# Patient Record
Sex: Female | Born: 1975
Health system: Southern US, Community
[De-identification: ages and names within clinical notes are randomized; demographics above are authoritative.]

## PROBLEM LIST (undated history)

## (undated) DIAGNOSIS — B009 Herpesviral infection, unspecified: Secondary | ICD-10-CM

## (undated) HISTORY — DX: Herpesviral infection, unspecified: B00.9

---

## 2001-07-31 HISTORY — PX: TUBAL LIGATION: SHX77

## 2005-07-31 HISTORY — PX: BREAST CYST ASPIRATION: SHX578

## 2006-05-25 ENCOUNTER — Ambulatory Visit: Payer: Self-pay

## 2006-11-08 ENCOUNTER — Ambulatory Visit: Payer: Self-pay | Admitting: Obstetrics and Gynecology

## 2006-11-12 ENCOUNTER — Ambulatory Visit: Payer: Self-pay | Admitting: Obstetrics and Gynecology

## 2012-10-29 LAB — HM PAP SMEAR

## 2015-01-07 ENCOUNTER — Encounter: Payer: Self-pay | Admitting: Obstetrics and Gynecology

## 2015-03-01 ENCOUNTER — Encounter: Payer: Self-pay | Admitting: *Deleted

## 2015-03-03 ENCOUNTER — Encounter: Payer: Self-pay | Admitting: Obstetrics and Gynecology

## 2015-12-30 ENCOUNTER — Other Ambulatory Visit: Payer: Self-pay | Admitting: Obstetrics and Gynecology

## 2015-12-30 ENCOUNTER — Encounter: Payer: Self-pay | Admitting: Obstetrics and Gynecology

## 2015-12-30 ENCOUNTER — Ambulatory Visit (INDEPENDENT_AMBULATORY_CARE_PROVIDER_SITE_OTHER): Payer: 59 | Admitting: Obstetrics and Gynecology

## 2015-12-30 VITALS — BP 117/71 | HR 77 | Ht 68.0 in | Wt 145.7 lb

## 2015-12-30 DIAGNOSIS — Z30431 Encounter for routine checking of intrauterine contraceptive device: Secondary | ICD-10-CM

## 2015-12-30 DIAGNOSIS — Z113 Encounter for screening for infections with a predominantly sexual mode of transmission: Secondary | ICD-10-CM

## 2015-12-30 DIAGNOSIS — Z01419 Encounter for gynecological examination (general) (routine) without abnormal findings: Secondary | ICD-10-CM | POA: Diagnosis not present

## 2015-12-30 MED ORDER — VALACYCLOVIR HCL 500 MG PO TABS: 500.0000 mg | ORAL_TABLET | Freq: Two times a day (BID) | ORAL | Status: DC

## 2015-12-30 NOTE — Progress Notes (Signed)
Subjective:   Marissa Harrison is a 40 y.o. No obstetric history on file. Caucasian female here for a routine well-woman exam.  No LMP recorded. Patient is not currently having periods (Reason: IUD).    Current complaints: desires STD screening as caught husband in affair last month PCP: me       does desire labs  Social History: Sexual: heterosexual Marital Status: married Living situation: with spouse Occupation: self-employed Tobacco/alcohol: no alcohol use Illicit drugs: no history of illicit drug use  The following portions of the patient's history were reviewed and updated as appropriate: allergies, current medications, past family history, past medical history, past social history, past surgical history and problem list.  Past Medical History Past Medical History  Diagnosis Date  . HSV infection     Past Surgical History Past Surgical History  Procedure Laterality Date  . Cesarean section  1992  . Tubal ligation  2003    Gynecologic History No obstetric history on file.  No LMP recorded. Patient is not currently having periods (Reason: IUD). Contraception: IUD Last Pap: 2014. Results were: normal Last mammogram: 2015. Results were: normal   Obstetric History OB History  No data available    Current Medications Current Outpatient Prescriptions on File Prior to Visit  Medication Sig Dispense Refill  . levonorgestrel (MIRENA) 20 MCG/24HR IUD 1 each by Intrauterine route once.    . valACYclovir (VALTREX) 500 MG tablet Take 500 mg by mouth 2 (two) times daily.     No current facility-administered medications on file prior to visit.    Review of Systems Patient denies any headaches, blurred vision, shortness of breath, chest pain, abdominal pain, problems with bowel movements, urination, or intercourse.  Objective:  BP 117/71 mmHg  Pulse 77  Ht 5\' 8"  (1.727 m)  Wt 145 lb 11.2 oz (66.089 kg)  BMI 22.16 kg/m2 Physical Exam  General:  Well developed, well  nourished, no acute distress. She is alert and oriented x3. Skin:  Warm and dry Neck:  Midline trachea, no thyromegaly or nodules Cardiovascular: Regular rate and rhythm, no murmur heard Lungs:  Effort normal, all lung fields clear to auscultation bilaterally Breasts:  No dominant palpable mass, retraction, or nipple discharge Abdomen:  Soft, non tender, no hepatosplenomegaly or masses Pelvic:  External genitalia is normal in appearance.  The vagina is normal in appearance. The cervix is bulbous, no CMT. IUD string noted  Thin prep pap is done with HR HPV cotesting. Uterus is felt to be normal size, shape, and contour.  No adnexal masses or tenderness noted.  Extremities:  No swelling or varicosities noted Psych:  She has a normal mood and affect  Assessment:   Healthy well-woman exam IUD check STD screen  Plan:  IUD check Labs obtained F/U 1 year for AE, or sooner if needed Mammogram scheduled  Tedford Berg Suzan NailerN Anmarie Fukushima, CNM

## 2015-12-30 NOTE — Patient Instructions (Signed)
  Place annual gynecologic exam patient instructions here.  Thank you for enrolling in MyChart. Please follow the instructions below to securely access your online medical record. MyChart allows you to send messages to your doctor, view your test results, manage appointments, and more.   How Do I Sign Up? 1. In your Internet browser, go to [employer] and enter [email or web address]. 2. Click on the Sign Up Now link in the Sign In box. You will see the New Member Sign Up page. 3. Enter your MyChart Access Code exactly as it appears below. You will not need to use this code after you've completed the sign-up process. If you do not sign up before the expiration date, you must request a new code.  MyChart Access Code: [email or web address] Expires: 12/31/2015 11:38 AM  4. Enter your Social Security Number ([id number]) and Date of Birth (mm/dd/yyyy) as indicated and click Submit. You will be taken to the next sign-up page. 5. Create a MyChart ID. This will be your MyChart login ID and cannot be changed, so think of one that is secure and easy to remember. 6. Create a MyChart password. You can change your password at any time. 7. Enter your Password Reset Question and Answer. This can be used at a later time if you forget your password.  8. Enter your e-mail address. You will receive e-mail notification when new information is available in MyChart. 9. Click Sign Up. You can now view your medical record.   Additional Information Remember, MyChart is NOT to be used for urgent needs. For medical emergencies, dial 911.

## 2016-01-01 LAB — RPR: RPR Ser Ql: NONREACTIVE

## 2016-01-01 LAB — LIPID PANEL
CHOL/HDL RATIO: 2 ratio (ref 0.0–4.4)
Cholesterol, Total: 203 mg/dL — ABNORMAL HIGH (ref 100–199)
HDL: 100 mg/dL (ref >39)
LDL Calculated: 88 mg/dL (ref 0–99)
TRIGLYCERIDES: 76 mg/dL (ref 0–149)
VLDL Cholesterol Cal: 15 mg/dL (ref 5–40)

## 2016-01-01 LAB — HEPATITIS PANEL, ACUTE
Hep A IgM: NEGATIVE
Hep B C IgM: NEGATIVE
Hep C Virus Ab: <0.1 s/co ratio (ref 0.0–0.9)
Hepatitis B Surface Ag: NEGATIVE

## 2016-01-01 LAB — COMPREHENSIVE METABOLIC PANEL
ALT: 17 IU/L (ref 0–32)
AST: 16 IU/L (ref 0–40)
Albumin/Globulin Ratio: 2.8 — ABNORMAL HIGH (ref 1.2–2.2)
Albumin: 5.1 g/dL (ref 3.5–5.5)
Alkaline Phosphatase: 57 IU/L (ref 39–117)
BILIRUBIN TOTAL: 0.3 mg/dL (ref 0.0–1.2)
BUN/Creatinine Ratio: 20 (ref 9–23)
BUN: 14 mg/dL (ref 6–24)
CALCIUM: 9.7 mg/dL (ref 8.7–10.2)
CO2: 24 mmol/L (ref 18–29)
Chloride: 101 mmol/L (ref 96–106)
Creatinine, Ser: 0.71 mg/dL (ref 0.57–1.00)
GFR calc non Af Amer: 105 mL/min/{1.73_m2} (ref >59)
GFR, EST AFRICAN AMERICAN: 121 mL/min/{1.73_m2} (ref >59)
GLOBULIN, TOTAL: 1.8 g/dL (ref 1.5–4.5)
Glucose: 84 mg/dL (ref 65–99)
POTASSIUM: 4.6 mmol/L (ref 3.5–5.2)
Sodium: 143 mmol/L (ref 134–144)
TOTAL PROTEIN: 6.9 g/dL (ref 6.0–8.5)

## 2016-01-01 LAB — HIV ANTIBODY (ROUTINE TESTING W REFLEX): HIV Screen 4th Generation wRfx: NONREACTIVE

## 2016-01-01 LAB — VITAMIN D 25 HYDROXY (VIT D DEFICIENCY, FRACTURES): Vit D, 25-Hydroxy: 47.1 ng/mL (ref 30.0–100.0)

## 2016-01-03 LAB — CYTOLOGY - PAP

## 2017-01-02 ENCOUNTER — Encounter: Payer: 59 | Admitting: Obstetrics and Gynecology

## 2017-01-09 ENCOUNTER — Encounter: Payer: 59 | Admitting: Obstetrics and Gynecology

## 2017-02-08 ENCOUNTER — Encounter: Payer: Self-pay | Admitting: Obstetrics and Gynecology

## 2017-02-08 ENCOUNTER — Ambulatory Visit (INDEPENDENT_AMBULATORY_CARE_PROVIDER_SITE_OTHER): Payer: 59 | Admitting: Obstetrics and Gynecology

## 2017-02-08 VITALS — BP 120/75 | HR 80 | Ht 67.5 in | Wt 147.2 lb

## 2017-02-08 DIAGNOSIS — Z30431 Encounter for routine checking of intrauterine contraceptive device: Secondary | ICD-10-CM

## 2017-02-08 DIAGNOSIS — Z01419 Encounter for gynecological examination (general) (routine) without abnormal findings: Secondary | ICD-10-CM

## 2017-02-08 DIAGNOSIS — F17200 Nicotine dependence, unspecified, uncomplicated: Secondary | ICD-10-CM

## 2017-02-08 MED ORDER — VALACYCLOVIR HCL 500 MG PO TABS: 500.0000 mg | ORAL_TABLET | Freq: Two times a day (BID) | ORAL | 4 refills | Status: DC

## 2017-02-08 NOTE — Patient Instructions (Signed)
Preventive Care 18-39 Years, Female Preventive care refers to lifestyle choices and visits with your health care provider that can promote health and wellness. What does preventive care include?  A yearly physical exam. This is also called an annual well check.  Dental exams once or twice a year.  Routine eye exams. Ask your health care provider how often you should have your eyes checked.  Personal lifestyle choices, including: ? Daily care of your teeth and gums. ? Regular physical activity. ? Eating a healthy diet. ? Avoiding tobacco and drug use. ? Limiting alcohol use. ? Practicing safe sex. ? Taking vitamin and mineral supplements as recommended by your health care provider. What happens during an annual well check? The services and screenings done by your health care provider during your annual well check will depend on your age, overall health, lifestyle risk factors, and family history of disease. Counseling Your health care provider may ask you questions about your:  Alcohol use.  Tobacco use.  Drug use.  Emotional well-being.  Home and relationship well-being.  Sexual activity.  Eating habits.  Work and work Statistician.  Method of birth control.  Menstrual cycle.  Pregnancy history.  Screening You may have the following tests or measurements:  Height, weight, and BMI.  Diabetes screening. This is done by checking your blood sugar (glucose) after you have not eaten for a while (fasting).  Blood pressure.  Lipid and cholesterol levels. These may be checked every 5 years starting at age 38.  Skin check.  Hepatitis C blood test.  Hepatitis B blood test.  Sexually transmitted disease (STD) testing.  BRCA-related cancer screening. This may be done if you have a family history of breast, ovarian, tubal, or peritoneal cancers.  Pelvic exam and Pap test. This may be done every 3 years starting at age 38. Starting at age 30, this may be done  every 5 years if you have a Pap test in combination with an HPV test.  Discuss your test results, treatment options, and if necessary, the need for more tests with your health care provider. Vaccines Your health care provider may recommend certain vaccines, such as:  Influenza vaccine. This is recommended every year.  Tetanus, diphtheria, and acellular pertussis (Tdap, Td) vaccine. You may need a Td booster every 10 years.  Varicella vaccine. You may need this if you have not been vaccinated.  HPV vaccine. If you are 39 or younger, you may need three doses over 6 months.  Measles, mumps, and rubella (MMR) vaccine. You may need at least one dose of MMR. You may also need a second dose.  Pneumococcal 13-valent conjugate (PCV13) vaccine. You may need this if you have certain conditions and were not previously vaccinated.  Pneumococcal polysaccharide (PPSV23) vaccine. You may need one or two doses if you smoke cigarettes or if you have certain conditions.  Meningococcal vaccine. One dose is recommended if you are age 68-21 years and a first-year college student living in a residence hall, or if you have one of several medical conditions. You may also need additional booster doses.  Hepatitis A vaccine. You may need this if you have certain conditions or if you travel or work in places where you may be exposed to hepatitis A.  Hepatitis B vaccine. You may need this if you have certain conditions or if you travel or work in places where you may be exposed to hepatitis B.  Haemophilus influenzae type b (Hib) vaccine. You may need this  if you have certain risk factors.  Talk to your health care provider about which screenings and vaccines you need and how often you need them. This information is not intended to replace advice given to you by your health care provider. Make sure you discuss any questions you have with your health care provider. Document Released: 09/12/2001 Document Revised:  04/05/2016 Document Reviewed: 05/18/2015 Elsevier Interactive Patient Education  2017 Elsevier Inc.  

## 2017-02-08 NOTE — Progress Notes (Signed)
Subjective:   Marissa Harrison is a [age] y.o. [phone] Caucasian female here for a routine well-woman exam.  No LMP recorded. Patient is not currently having periods (Reason: IUD).    Current complaints: none PCP: me       doesn't desire labs  Social History: Sexual: heterosexual Marital Status: married Living situation: with family Occupation: self-employed Tobacco/alcohol: SMOKER Illicit drugs: no history of illicit drug use  The following portions of the patient's history were reviewed and updated as appropriate: allergies, current medications, past family history, past medical history, past social history, past surgical history and problem list.  Past Medical History Past Medical History:  Diagnosis Date  . HSV infection     Past Surgical History Past Surgical History:  Procedure Laterality Date  . CESAREAN SECTION  1992  . TUBAL LIGATION  2003    Gynecologic History [id number]  No LMP recorded. Patient is not currently having periods (Reason: IUD). Contraception: IUD Last Pap: 2017. Results were: normal Last mammogram: 2014. Results were: normal  Obstetric History OB History  Gravida Para Term Preterm AB Living  4 2     2 2   SAB TAB Ectopic Multiple Live Births  1       2    # Outcome Date GA Lbr Len/2nd Weight Sex Delivery Anes PTL Lv  4 AB           3 Para     M CS-LTranv   LIV  2 Para     F Vag-Spont   LIV  1 SAB               Current Medications Current Outpatient Prescriptions on File Prior to Visit  Medication Sig Dispense Refill  . levonorgestrel (MIRENA) 20 MCG/24HR IUD 1 each by Intrauterine route once.    . valACYclovir (VALTREX) 500 MG tablet Take 1 tablet (500 mg total) by mouth 2 (two) times daily. 60 tablet 4   No current facility-administered medications on file prior to visit.     Review of Systems Patient denies any headaches, blurred vision, shortness of breath, chest pain, abdominal pain, problems with bowel movements, urination, or  intercourse.  Objective:  BP 120/75 (BP Location: Left Arm, Patient Position: Sitting, Cuff Size: Normal)   Pulse 80   Ht 5' 7.5" (1.715 m)   Wt 147 lb 3.2 oz (66.8 kg)   BMI 22.71 kg/m  Physical Exam  General:  Well developed, well nourished, no acute distress. She is alert and oriented x3. Skin:  Warm and dry Neck:  Midline trachea, no thyromegaly or nodules Cardiovascular: Regular rate and rhythm, no murmur heard Lungs:  Effort normal, all lung fields clear to auscultation bilaterally Breasts:  No dominant palpable mass, retraction, or nipple discharge Abdomen:  Soft, non tender, no hepatosplenomegaly or masses Pelvic:  External genitalia is normal in appearance.  The vagina is normal in appearance. The cervix is bulbous, no CMT.  Thin prep pap is not DONE. Uterus is felt to be normal size, shape, and contour.  No adnexal masses or tenderness noted. iud STRING noted. Extremities:  No swelling or varicosities noted Psych:  She has a normal mood and affect  Assessment:   Healthy well-woman exam IUD check noted Smoker-not interested in quiting    Plan:    F/U 1 year for AE, or sooner if needed Mammogram ordered Declined smoking cessation  Melody Suzan NailerN Shambley, CNM

## 2017-02-09 ENCOUNTER — Other Ambulatory Visit: Payer: Self-pay

## 2017-02-09 DIAGNOSIS — N6310 Unspecified lump in the right breast, unspecified quadrant: Secondary | ICD-10-CM

## 2017-03-01 ENCOUNTER — Ambulatory Visit
Admission: RE | Admit: 2017-03-01 | Discharge: 2017-03-01 | Disposition: A | Discharge status: Home / Self Care | Payer: 59 | Source: Ambulatory Visit | Attending: Obstetrics and Gynecology | Admitting: Obstetrics and Gynecology

## 2017-03-01 ENCOUNTER — Other Ambulatory Visit: Payer: Self-pay | Admitting: Obstetrics and Gynecology

## 2017-03-01 DIAGNOSIS — N6310 Unspecified lump in the right breast, unspecified quadrant: Secondary | ICD-10-CM

## 2017-03-01 DIAGNOSIS — N6311 Unspecified lump in the right breast, upper outer quadrant: Secondary | ICD-10-CM | POA: Insufficient documentation

## 2017-03-01 DIAGNOSIS — R928 Other abnormal and inconclusive findings on diagnostic imaging of breast: Secondary | ICD-10-CM

## 2017-03-22 ENCOUNTER — Other Ambulatory Visit: Payer: 59

## 2017-04-13 ENCOUNTER — Ambulatory Visit
Admission: RE | Admit: 2017-04-13 | Discharge: 2017-04-13 | Disposition: A | Discharge status: Home / Self Care | Payer: 59 | Source: Ambulatory Visit | Attending: Obstetrics and Gynecology | Admitting: Obstetrics and Gynecology

## 2017-04-13 DIAGNOSIS — N6310 Unspecified lump in the right breast, unspecified quadrant: Secondary | ICD-10-CM | POA: Diagnosis not present

## 2017-04-13 DIAGNOSIS — R928 Other abnormal and inconclusive findings on diagnostic imaging of breast: Secondary | ICD-10-CM | POA: Diagnosis present

## 2017-04-13 HISTORY — PX: BREAST BIOPSY: SHX20

## 2017-04-17 LAB — SURGICAL PATHOLOGY

## 2017-09-25 ENCOUNTER — Other Ambulatory Visit: Payer: Self-pay | Admitting: *Deleted

## 2017-09-25 ENCOUNTER — Telehealth: Payer: Self-pay | Admitting: Obstetrics and Gynecology

## 2017-09-25 DIAGNOSIS — R928 Other abnormal and inconclusive findings on diagnostic imaging of breast: Secondary | ICD-10-CM

## 2017-09-25 NOTE — Telephone Encounter (Signed)
The patient called and stated that she [facility] Mel[facility], No other information was disclosed. Please advise.

## 2017-09-25 NOTE — Telephone Encounter (Signed)
Put orders in for 6 month follow up

## 2017-09-28 ENCOUNTER — Other Ambulatory Visit: Payer: Self-pay | Admitting: *Deleted

## 2017-09-28 DIAGNOSIS — R928 Other abnormal and inconclusive findings on diagnostic imaging of breast: Secondary | ICD-10-CM

## 2017-10-29 ENCOUNTER — Ambulatory Visit
Admission: RE | Admit: 2017-10-29 | Discharge: 2017-10-29 | Disposition: A | Discharge status: Home / Self Care | Payer: 59 | Source: Ambulatory Visit | Attending: Certified Nurse Midwife | Admitting: Certified Nurse Midwife

## 2017-10-29 DIAGNOSIS — R928 Other abnormal and inconclusive findings on diagnostic imaging of breast: Secondary | ICD-10-CM

## 2018-02-14 ENCOUNTER — Encounter: Payer: Self-pay | Admitting: Obstetrics and Gynecology

## 2018-02-14 ENCOUNTER — Ambulatory Visit (INDEPENDENT_AMBULATORY_CARE_PROVIDER_SITE_OTHER): Payer: 59 | Admitting: Obstetrics and Gynecology

## 2018-02-14 VITALS — BP 110/67 | HR 79 | Ht 68.0 in | Wt 146.3 lb

## 2018-02-14 DIAGNOSIS — Z01419 Encounter for gynecological examination (general) (routine) without abnormal findings: Secondary | ICD-10-CM | POA: Diagnosis not present

## 2018-02-14 MED ORDER — VALACYCLOVIR HCL 500 MG PO TABS
500.0000 mg | ORAL_TABLET | Freq: Two times a day (BID) | ORAL | 4 refills | Status: DC
Start: 2018-02-14 — End: 2020-06-29

## 2018-02-14 NOTE — Patient Instructions (Signed)
Preventive Care 18-39 Years, Female Preventive care refers to lifestyle choices and visits with your health care provider that can promote health and wellness. What does preventive care include?  A yearly physical exam. This is also called an annual well check.  Dental exams once or twice a year.  Routine eye exams. Ask your health care provider how often you should have your eyes checked.  Personal lifestyle choices, including: ? Daily care of your teeth and gums. ? Regular physical activity. ? Eating a healthy diet. ? Avoiding tobacco and drug use. ? Limiting alcohol use. ? Practicing safe sex. ? Taking vitamin and mineral supplements as recommended by your health care provider. What happens during an annual well check? The services and screenings done by your health care provider during your annual well check will depend on your age, overall health, lifestyle risk factors, and family history of disease. Counseling Your health care provider may ask you questions about your:  Alcohol use.  Tobacco use.  Drug use.  Emotional well-being.  Home and relationship well-being.  Sexual activity.  Eating habits.  Work and work Statistician.  Method of birth control.  Menstrual cycle.  Pregnancy history.  Screening You may have the following tests or measurements:  Height, weight, and BMI.  Diabetes screening. This is done by checking your blood sugar (glucose) after you have not eaten for a while (fasting).  Blood pressure.  Lipid and cholesterol levels. These may be checked every 5 years starting at age 38.  Skin check.  Hepatitis C blood test.  Hepatitis B blood test.  Sexually transmitted disease (STD) testing.  BRCA-related cancer screening. This may be done if you have a family history of breast, ovarian, tubal, or peritoneal cancers.  Pelvic exam and Pap test. This may be done every 3 years starting at age 38. Starting at age 30, this may be done  every 5 years if you have a Pap test in combination with an HPV test.  Discuss your test results, treatment options, and if necessary, the need for more tests with your health care provider. Vaccines Your health care provider may recommend certain vaccines, such as:  Influenza vaccine. This is recommended every year.  Tetanus, diphtheria, and acellular pertussis (Tdap, Td) vaccine. You may need a Td booster every 10 years.  Varicella vaccine. You may need this if you have not been vaccinated.  HPV vaccine. If you are 39 or younger, you may need three doses over 6 months.  Measles, mumps, and rubella (MMR) vaccine. You may need at least one dose of MMR. You may also need a second dose.  Pneumococcal 13-valent conjugate (PCV13) vaccine. You may need this if you have certain conditions and were not previously vaccinated.  Pneumococcal polysaccharide (PPSV23) vaccine. You may need one or two doses if you smoke cigarettes or if you have certain conditions.  Meningococcal vaccine. One dose is recommended if you are age 68-21 years and a first-year college student living in a residence hall, or if you have one of several medical conditions. You may also need additional booster doses.  Hepatitis A vaccine. You may need this if you have certain conditions or if you travel or work in places where you may be exposed to hepatitis A.  Hepatitis B vaccine. You may need this if you have certain conditions or if you travel or work in places where you may be exposed to hepatitis B.  Haemophilus influenzae type b (Hib) vaccine. You may need this  if you have certain risk factors.  Talk to your health care provider about which screenings and vaccines you need and how often you need them. This information is not intended to replace advice given to you by your health care provider. Make sure you discuss any questions you have with your health care provider. Document Released: 09/12/2001 Document Revised:  04/05/2016 Document Reviewed: 05/18/2015 Elsevier Interactive Patient Education  2018 Elsevier Inc.  

## 2018-02-14 NOTE — Progress Notes (Signed)
Subjective:   Marissa Harrison is a 42 y.o. [phone] Caucasian female here for a routine well-woman exam.  No LMP recorded. (Menstrual status: IUD).    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  Past Medical History Past Medical History:  Diagnosis Date  . HSV infection     Past Surgical History Past Surgical History:  Procedure Laterality Date  . BREAST BIOPSY Right 04/13/2017   BENIGN NODULAR ADENOSIS, wing shaped marker  . BREAST CYST ASPIRATION Right 2007   neg  . CESAREAN SECTION  1992  . TUBAL LIGATION  2003    Gynecologic History [id number]  No LMP recorded. (Menstrual status: IUD). Contraception: IUD Last Pap: 2017. Results were: normal Last mammogram: 02/2018. Results were: abnormal on rt with negative biopsy  Obstetric History OB History  Gravida Para Term Preterm AB Living  4 2     2 2   SAB TAB Ectopic Multiple Live Births  1       2    # Outcome Date GA Lbr Len/2nd Weight Sex Delivery Anes PTL Lv  4 AB           3 Para     M CS-LTranv   LIV  2 Para     F Vag-Spont   LIV  1 SAB             Current Medications Current Outpatient Medications on File Prior to Visit  Medication Sig Dispense Refill  . levonorgestrel (MIRENA) 20 MCG/24HR IUD 1 each by Intrauterine route once.    . valACYclovir (VALTREX) 500 MG tablet Take 1 tablet (500 mg total) by mouth 2 (two) times daily. 60 tablet 4   No current  facility-administered medications on file prior to visit.     Review of Systems Patient denies any headaches, blurred vision, shortness of breath, chest pain, abdominal pain, problems with bowel movements, urination, or intercourse.  Objective:  BP 110/67   Pulse 79   Ht 5\' 8"  (1.727 m)   Wt 146 lb 4.8 oz (66.4 kg)   BMI 22.24 kg/m  Physical Exam  General:  Well developed, well nourished, no acute distress. She is alert and oriented x3. Skin:  Warm and dry Neck:  Midline trachea, no thyromegaly or nodules Cardiovascular: Regular rate and rhythm, no murmur heard Lungs:  Effort normal, all lung fields clear to auscultation bilaterally Breasts:  No dominant palpable mass, retraction, or nipple discharge Abdomen:  Soft, non tender, no hepatosplenomegaly or masses Pelvic:  External genitalia is normal in appearance.  The vagina is normal in appearance. The cervix is bulbous, no CMT. IUD string visualized. Thin prep pap is not done .IUD string. Uterus is felt to be normal size, shape, and contour.  No adnexal masses or tenderness noted. Extremities:  No swelling or varicosities noted Psych:  She has a normal mood and affect  Assessment:   Healthy well-woman exam IUD check Smoker- no interest in quiting   Plan:  Labs to be obtained today  with follow up as necessary F/U 1 year for AE, or sooner if needed Mammogram ordered  Alayasia Breeding Suzan Nailer, CNM

## 2018-02-15 LAB — COMPREHENSIVE METABOLIC PANEL
ALBUMIN: 4.5 g/dL (ref 3.5–5.5)
ALK PHOS: 72 IU/L (ref 39–117)
ALT: 18 IU/L (ref 0–32)
AST: 19 IU/L (ref 0–40)
Albumin/Globulin Ratio: 2 (ref 1.2–2.2)
BUN / CREAT RATIO: 27 — AB (ref 9–23)
BUN: 19 mg/dL (ref 6–24)
Bilirubin Total: 0.3 mg/dL (ref 0.0–1.2)
CHLORIDE: 101 mmol/L (ref 96–106)
CO2: 25 mmol/L (ref 20–29)
CREATININE: 0.7 mg/dL (ref 0.57–1.00)
Calcium: 9.5 mg/dL (ref 8.7–10.2)
GFR calc Af Amer: 122 mL/min/{1.73_m2} (ref >59)
GFR calc non Af Amer: 106 mL/min/{1.73_m2} (ref >59)
GLOBULIN, TOTAL: 2.2 g/dL (ref 1.5–4.5)
Glucose: 83 mg/dL (ref 65–99)
Potassium: 4.3 mmol/L (ref 3.5–5.2)
SODIUM: 141 mmol/L (ref 134–144)
Total Protein: 6.7 g/dL (ref 6.0–8.5)

## 2018-02-15 LAB — LIPID PANEL
Chol/HDL Ratio: 1.9 ratio (ref 0.0–4.4)
Cholesterol, Total: 184 mg/dL (ref 100–199)
HDL: 96 mg/dL (ref >39)
LDL Calculated: 76 mg/dL (ref 0–99)
Triglycerides: 58 mg/dL (ref 0–149)
VLDL Cholesterol Cal: 12 mg/dL (ref 5–40)

## 2018-02-18 ENCOUNTER — Telehealth: Payer: Self-pay | Admitting: Obstetrics and Gynecology

## 2018-02-18 NOTE — Telephone Encounter (Signed)
I called [facility] to schedule the patients mammogram and the representative I spoke with stated that she needs to have the following orders put in [id number], [id number], and [id number] (Diagnostic bilateral mammogram for left &right breast/ w/ u/s). The patient Was advised to follow up in 6 months from her prior appointment, When the orders are in I will call and schedule and contact the patient. Please advise.

## 2018-02-25 ENCOUNTER — Other Ambulatory Visit: Payer: Self-pay | Admitting: *Deleted

## 2018-02-25 DIAGNOSIS — N6310 Unspecified lump in the right breast, unspecified quadrant: Secondary | ICD-10-CM

## 2018-02-25 NOTE — Telephone Encounter (Signed)
Put in new orders

## 2018-05-08 ENCOUNTER — Ambulatory Visit
Admission: RE | Admit: 2018-05-08 | Discharge: 2018-05-08 | Disposition: A | Discharge status: Home / Self Care | Payer: 59 | Source: Ambulatory Visit | Attending: Obstetrics and Gynecology | Admitting: Obstetrics and Gynecology

## 2018-05-08 DIAGNOSIS — N6310 Unspecified lump in the right breast, unspecified quadrant: Secondary | ICD-10-CM

## 2018-09-02 DIAGNOSIS — J1189 Influenza due to unidentified influenza virus with other manifestations: Secondary | ICD-10-CM | POA: Diagnosis not present

## 2018-09-11 DIAGNOSIS — L309 Dermatitis, unspecified: Secondary | ICD-10-CM | POA: Diagnosis not present

## 2018-09-11 DIAGNOSIS — R208 Other disturbances of skin sensation: Secondary | ICD-10-CM | POA: Diagnosis not present

## 2018-09-11 DIAGNOSIS — D225 Melanocytic nevi of trunk: Secondary | ICD-10-CM | POA: Diagnosis not present

## 2018-09-11 DIAGNOSIS — D2262 Melanocytic nevi of left upper limb, including shoulder: Secondary | ICD-10-CM | POA: Diagnosis not present

## 2018-09-11 DIAGNOSIS — L538 Other specified erythematous conditions: Secondary | ICD-10-CM | POA: Diagnosis not present

## 2018-09-11 DIAGNOSIS — B078 Other viral warts: Secondary | ICD-10-CM | POA: Diagnosis not present

## 2018-09-11 DIAGNOSIS — D2261 Melanocytic nevi of right upper limb, including shoulder: Secondary | ICD-10-CM | POA: Diagnosis not present

## 2019-02-20 ENCOUNTER — Encounter: Payer: 59 | Admitting: Obstetrics and Gynecology

## 2019-02-28 ENCOUNTER — Encounter: Payer: Self-pay | Admitting: Obstetrics and Gynecology

## 2019-02-28 ENCOUNTER — Other Ambulatory Visit (HOSPITAL_COMMUNITY)
Admission: RE | Admit: 2019-02-28 | Discharge: 2019-02-28 | Disposition: A | Discharge status: Home / Self Care | Payer: BC Managed Care – PPO | Source: Ambulatory Visit | Attending: Obstetrics and Gynecology | Admitting: Obstetrics and Gynecology

## 2019-02-28 ENCOUNTER — Encounter: Payer: 59 | Admitting: Obstetrics and Gynecology

## 2019-02-28 ENCOUNTER — Other Ambulatory Visit: Payer: Self-pay

## 2019-02-28 ENCOUNTER — Ambulatory Visit (INDEPENDENT_AMBULATORY_CARE_PROVIDER_SITE_OTHER): Payer: BC Managed Care – PPO | Admitting: Obstetrics and Gynecology

## 2019-02-28 VITALS — BP 127/71 | HR 72 | Ht 68.0 in | Wt 149.1 lb

## 2019-02-28 DIAGNOSIS — Z01419 Encounter for gynecological examination (general) (routine) without abnormal findings: Secondary | ICD-10-CM

## 2019-02-28 DIAGNOSIS — Z30431 Encounter for routine checking of intrauterine contraceptive device: Secondary | ICD-10-CM

## 2019-02-28 MED ORDER — NAFTIFINE HCL 1 % EX GEL: 1.0000 "application " | CUTANEOUS | 2 refills | Status: DC | PRN

## 2019-02-28 NOTE — Progress Notes (Signed)
Subjective:   Marissa Harrison is a 43 y.o. [phone] Caucasian female here for a routine well-woman exam.  No LMP recorded. (Menstrual status: IUD).   IUD due to be changed next year. Current complaints: none PCP: me       doesn't desire labs  Social History: Sexual: heterosexual Marital Status: married Living situation: with family Occupation: self-employed Tobacco/alcohol: smokes 1ppd, occasional alcohol use Illicit drugs: no history of illicit drug use  The following portions of the patient's history were reviewed and updated as appropriate: allergies, current medications, past family history, past medical history, past social history, past surgical history and problem list.  Past Medical History Past Medical History:  Diagnosis Date  . HSV infection     Past Surgical History Past Surgical History:  Procedure Laterality Date  . BREAST BIOPSY Right 04/13/2017   BENIGN NODULAR ADENOSIS, wing shaped marker  . BREAST CYST ASPIRATION Right 2007   neg  . CESAREAN SECTION  1992  . TUBAL LIGATION  2003    Gynecologic History [id number]  No LMP recorded. (Menstrual status: IUD). Contraception: IUD and tubal ligation Last Pap: 2017. Results were: normal Last mammogram: 04/2018. Results were: normal   Obstetric History OB History  Gravida Para Term Preterm AB Living  4 2     2 2   SAB TAB Ectopic Multiple Live Births  1       2    # Outcome Date GA Lbr Len/2nd Weight Sex Delivery Anes PTL Lv  4 AB           3 Para     M CS-LTranv   LIV  2 Para     F Vag-Spont   LIV  1 SAB             Current Medications Current Outpatient Medications on File Prior to Visit  Medication Sig Dispense Refill  . levonorgestrel (MIRENA) 20 MCG/24HR IUD 1 each by Intrauterine route once.    . valACYclovir (VALTREX) 500 MG tablet Take 1 tablet (500 mg total) by mouth 2 (two) times daily. 60 tablet 4   No current facility-administered medications on file prior to visit.     Review of  Systems Patient denies any headaches, blurred vision, shortness of breath, chest pain, abdominal pain, problems with bowel movements, urination, or intercourse.  Objective:  BP 127/71   Pulse 72   Ht 5\' 8"  (1.727 m)   Wt 149 lb 1.6 oz (67.6 kg)   BMI 22.67 kg/m  Physical Exam  General:  Well developed, well nourished, no acute distress. She is alert and oriented x3. Skin:  Warm and dry Neck:  Midline trachea, no thyromegaly or nodules Cardiovascular: Regular rate and rhythm, no murmur heard Lungs:  Effort normal, all lung fields clear to auscultation bilaterally Breasts:  No dominant palpable mass, retraction, or nipple discharge Abdomen:  Soft, non tender, no hepatosplenomegaly or masses Pelvic:  External genitalia is normal in appearance.  The vagina is normal in appearance. The cervix is bulbous, no CMT.  Thin prep pap is done with HR HPV cotesting. Uterus is felt to be normal size, shape, and contour.  IUD string noted. No adnexal masses or tenderness noted. Extremities:  No swelling or varicosities noted Psych:  She has a normal mood and affect  Assessment:   Healthy well-woman exam IUD check Plan:  Will wait on labs until next year F/U 1 year for AE, or sooner if needed Mammogram ordered  Marissa Harrison, CNM

## 2019-02-28 NOTE — Patient Instructions (Signed)
 Preventive Care 21-45 Years Old, Female Preventive care refers to visits with your health care provider and lifestyle choices that can promote health and wellness. This includes:  A yearly physical exam. This may also be called an annual well check.  Regular dental visits and eye exams.  Immunizations.  Screening for certain conditions.  Healthy lifestyle choices, such as eating a healthy diet, getting regular exercise, not using drugs or products that contain nicotine and tobacco, and limiting alcohol use. What can I expect for my preventive care visit? Physical exam Your health care provider will check your:  Height and weight. This may be used to calculate body mass index (BMI), which tells if you are at a healthy weight.  Heart rate and blood pressure.  Skin for abnormal spots. Counseling Your health care provider may ask you questions about your:  Alcohol, tobacco, and drug use.  Emotional well-being.  Home and relationship well-being.  Sexual activity.  Eating habits.  Work and work environment.  Method of birth control.  Menstrual cycle.  Pregnancy history. What immunizations do I need?  Influenza (flu) vaccine  This is recommended every year. Tetanus, diphtheria, and pertussis (Tdap) vaccine  You may need a Td booster every 10 years. Varicella (chickenpox) vaccine  You may need this if you have not been vaccinated. Human papillomavirus (HPV) vaccine  If recommended by your health care provider, you may need three doses over 6 months. Measles, mumps, and rubella (MMR) vaccine  You may need at least one dose of MMR. You may also need a second dose. Meningococcal conjugate (MenACWY) vaccine  One dose is recommended if you are age 19-21 years and a first-year college student living in a residence hall, or if you have one of several medical conditions. You may also need additional booster doses. Pneumococcal conjugate (PCV13) vaccine  You may need  this if you have certain conditions and were not previously vaccinated. Pneumococcal polysaccharide (PPSV23) vaccine  You may need one or two doses if you smoke cigarettes or if you have certain conditions. Hepatitis A vaccine  You may need this if you have certain conditions or if you travel or work in places where you may be exposed to hepatitis A. Hepatitis B vaccine  You may need this if you have certain conditions or if you travel or work in places where you may be exposed to hepatitis B. Haemophilus influenzae type b (Hib) vaccine  You may need this if you have certain conditions. You may receive vaccines as individual doses or as more than one vaccine together in one shot (combination vaccines). Talk with your health care provider about the risks and benefits of combination vaccines. What tests do I need?  Blood tests  Lipid and cholesterol levels. These may be checked every 5 years starting at age 20.  Hepatitis C test.  Hepatitis B test. Screening  Diabetes screening. This is done by checking your blood sugar (glucose) after you have not eaten for a while (fasting).  Sexually transmitted disease (STD) testing.  BRCA-related cancer screening. This may be done if you have a family history of breast, ovarian, tubal, or peritoneal cancers.  Pelvic exam and Pap test. This may be done every 3 years starting at age 21. Starting at age 30, this may be done every 5 years if you have a Pap test in combination with an HPV test. Talk with your health care provider about your test results, treatment options, and if necessary, the need for more   tests. Follow these instructions at home: Eating and drinking   Eat a diet that includes fresh fruits and vegetables, whole grains, lean protein, and low-fat dairy.  Take vitamin and mineral supplements as recommended by your health care provider.  Do not drink alcohol if: ? Your health care provider tells you not to drink. ? You are  pregnant, may be pregnant, or are planning to become pregnant.  If you drink alcohol: ? Limit how much you have to 0-1 drink a day. ? Be aware of how much alcohol is in your drink. In the [place] one drink equals one 12 oz bottle of beer (355 mL), one 5 oz glass of wine (148 mL), or one 1 oz glass of hard liquor (44 mL). Lifestyle  Take daily care of your teeth and gums.  Stay active. Exercise for at least 30 minutes on 5 or more days each week.  Do not use any products that contain nicotine or tobacco, such as cigarettes, e-cigarettes, and chewing tobacco. If you need help quitting, ask your health care provider.  If you are sexually active, practice safe sex. Use a condom or other form of birth control (contraception) in order to prevent pregnancy and STIs (sexually transmitted infections). If you plan to become pregnant, see your health care provider for a preconception visit. What's next?  Visit your health care provider once a year for a well check visit.  Ask your health care provider how often you should have your eyes and teeth checked.  Stay up to date on all vaccines. This information is not intended to replace advice given to you by your health care provider. Make sure you discuss any questions you have with your health care provider. Document Released: 09/12/2001 Document Revised: 03/28/2018 Document Reviewed: 03/28/2018 Elsevier Patient Education  2020 [employer].

## 2019-03-07 LAB — CYTOLOGY - PAP
Chlamydia: NEGATIVE
Diagnosis: NEGATIVE
HPV: NOT DETECTED
Neisseria Gonorrhea: NEGATIVE

## 2019-08-05 ENCOUNTER — Ambulatory Visit
Admission: RE | Admit: 2019-08-05 | Discharge: 2019-08-05 | Disposition: A | Discharge status: Home / Self Care | Payer: BC Managed Care – PPO | Source: Ambulatory Visit | Attending: Obstetrics and Gynecology | Admitting: Obstetrics and Gynecology

## 2019-08-05 DIAGNOSIS — Z1231 Encounter for screening mammogram for malignant neoplasm of breast: Secondary | ICD-10-CM | POA: Insufficient documentation

## 2019-08-05 DIAGNOSIS — Z01419 Encounter for gynecological examination (general) (routine) without abnormal findings: Secondary | ICD-10-CM

## 2019-08-27 ENCOUNTER — Ambulatory Visit (INDEPENDENT_AMBULATORY_CARE_PROVIDER_SITE_OTHER): Payer: BC Managed Care – PPO | Admitting: Obstetrics and Gynecology

## 2019-08-27 ENCOUNTER — Other Ambulatory Visit: Payer: Self-pay

## 2019-08-27 ENCOUNTER — Encounter: Payer: Self-pay | Admitting: Obstetrics and Gynecology

## 2019-08-27 VITALS — BP 156/61 | HR 94 | Ht 68.0 in | Wt 142.0 lb

## 2019-08-27 DIAGNOSIS — Z30432 Encounter for removal of intrauterine contraceptive device: Secondary | ICD-10-CM | POA: Diagnosis not present

## 2019-08-27 DIAGNOSIS — Z30430 Encounter for insertion of intrauterine contraceptive device: Secondary | ICD-10-CM | POA: Diagnosis not present

## 2019-08-27 NOTE — Progress Notes (Signed)
HPI:      [name] is a 44 y.o. [id number] who LMP was No LMP recorded. (Menstrual status: IUD).  Subjective:   She presents today because her IUD has expired and she would like a new one placed.  She has had 3 IUDs and has had good experiences with all of them.  She typically does not have menses with them.    Hx: The following portions of the patient's history were reviewed and updated as appropriate:             She  has a past medical history of HSV infection. She does not have a problem list on file. She  has a past surgical history that includes Cesarean section (1992); Tubal ligation (2003); Breast cyst aspiration (Right, 2007); and Breast biopsy (Right, 04/13/2017). Her family history includes Breast cancer in her paternal aunt; Breast cancer (age of onset: 35) in her maternal grandmother; Cancer in her maternal grandfather; Cirrhosis in her father; Hypertension in her father. She  reports that she has been smoking cigarettes. She has a 10.00 pack-year smoking history. She has never used smokeless tobacco. She reports current alcohol use. She reports that she does not use drugs. She has a current medication list which includes the following prescription(s): levonorgestrel, valacyclovir, and naftifine hcl. She has No Known Allergies.       Review of Systems:  Review of Systems  Constitutional: Denied constitutional symptoms, night sweats, recent illness, fatigue, fever, insomnia and weight loss.  Eyes: Denied eye symptoms, eye pain, photophobia, vision change and visual disturbance.  Ears/Nose/Throat/Neck: Denied ear, nose, throat or neck symptoms, hearing loss, nasal discharge, sinus congestion and sore throat.  Cardiovascular: Denied cardiovascular symptoms, arrhythmia, chest pain/pressure, edema, exercise intolerance, orthopnea and palpitations.  Respiratory: Denied pulmonary symptoms, asthma, pleuritic pain, productive sputum, cough, dyspnea and wheezing.  Gastrointestinal:  Denied, gastro-esophageal reflux, melena, nausea and vomiting.  Genitourinary: Denied genitourinary symptoms including symptomatic vaginal discharge, pelvic relaxation issues, and urinary complaints.  Musculoskeletal: Denied musculoskeletal symptoms, stiffness, swelling, muscle weakness and myalgia.  Dermatologic: Denied dermatology symptoms, rash and scar.  Neurologic: Denied neurology symptoms, dizziness, headache, neck pain and syncope.  Psychiatric: Denied psychiatric symptoms, anxiety and depression.  Endocrine: Denied endocrine symptoms including hot flashes and night sweats.   Meds:   Current Outpatient Medications on File Prior to Visit  Medication Sig Dispense Refill  . levonorgestrel (MIRENA) 20 MCG/24HR IUD 1 each by Intrauterine route once.    . valACYclovir (VALTREX) 500 MG tablet Take 1 tablet (500 mg total) by mouth 2 (two) times daily. 60 tablet 4  . Naftifine HCl 1 % GEL Apply 1 application topically as needed. (Patient not taking: Reported on 08/27/2019) 90 g 2   No current facility-administered medications on file prior to visit.    Objective:     Vitals:   08/27/19 1509  BP: (!) 156/61  Pulse: 94              Physical examination   Pelvic:   Vulva: Normal appearance.  No lesions.  Vagina: No lesions or abnormalities noted.  Support: Normal pelvic support.  Urethra No masses tenderness or scarring.  Meatus Normal size without lesions or prolapse.  Cervix: Normal appearance.  No lesions.  Anus: Normal exam.  No lesions.  Perineum: Normal exam.  No lesions.        Bimanual   Uterus: Normal size.  Non-tender.  Mobile.  Mid  Adnexae: No masses.  Non-tender to palpation.  Cul-de-sac: Negative for abnormality.   IUD Removal Strings of IUD identified and grasped.  IUD removed without problem.  Pt tolerated this well.  IUD noted to be intact.  IUD Procedure Pt has read the booklet and signed the appropriate forms regarding the Mirena IUD.  All of her questions  have been answered.   The cervix was cleansed with betadine solution.  After sounding the uterus and noting the position, the IUD was placed in the usual manner without problem.  The string was cut to the appropriate length.  The patient tolerated the procedure well.    Assessment:    [id number] There are no problems to display for this patient.    1. Encounter for insertion of mirena IUD   2. Encounter for IUD removal        Plan:            1.  IUD placed without problem-expectant management Orders No orders of the defined types were placed in this encounter.   No orders of the defined types were placed in this encounter.     F/U  Return in about 4 weeks (around 09/24/2019) for For IUD f/u.  Finis Bud, M.D. 08/27/2019 3:51 PM

## 2019-09-24 ENCOUNTER — Encounter: Payer: Self-pay | Admitting: Obstetrics and Gynecology

## 2019-09-24 ENCOUNTER — Ambulatory Visit (INDEPENDENT_AMBULATORY_CARE_PROVIDER_SITE_OTHER): Payer: BC Managed Care – PPO | Admitting: Obstetrics and Gynecology

## 2019-09-24 ENCOUNTER — Other Ambulatory Visit: Payer: Self-pay

## 2019-09-24 VITALS — BP 128/75 | HR 89 | Ht 68.0 in | Wt 143.8 lb

## 2019-09-24 DIAGNOSIS — Z30431 Encounter for routine checking of intrauterine contraceptive device: Secondary | ICD-10-CM

## 2019-09-24 NOTE — Progress Notes (Signed)
HPI:      [name] is a 44 y.o. [id number] who LMP was No LMP recorded. (Menstrual status: IUD).  Subjective:   She presents today for follow-up of her IUD.  She reports that she had a few days of spotting after insertion but has had no bleeding since.  She has had intercourse without problem.    Hx: The following portions of the patient's history were reviewed and updated as appropriate:             She  has a past medical history of HSV infection. She does not have a problem list on file. She  has a past surgical history that includes Cesarean section (1992); Tubal ligation (2003); Breast cyst aspiration (Right, 2007); and Breast biopsy (Right, 04/13/2017). Her family history includes Breast cancer in her paternal aunt; Breast cancer (age of onset: 84) in her maternal grandmother; Cancer in her maternal grandfather; Cirrhosis in her father; Hypertension in her father. She  reports that she has been smoking cigarettes. She has a 10.00 pack-year smoking history. She has never used smokeless tobacco. She reports current alcohol use. She reports that she does not use drugs. She has a current medication list which includes the following prescription(s): levonorgestrel, naftifine hcl, and valacyclovir. She has No Known Allergies.       Review of Systems:  Review of Systems  Constitutional: Denied constitutional symptoms, night sweats, recent illness, fatigue, fever, insomnia and weight loss.  Eyes: Denied eye symptoms, eye pain, photophobia, vision change and visual disturbance.  Ears/Nose/Throat/Neck: Denied ear, nose, throat or neck symptoms, hearing loss, nasal discharge, sinus congestion and sore throat.  Cardiovascular: Denied cardiovascular symptoms, arrhythmia, chest pain/pressure, edema, exercise intolerance, orthopnea and palpitations.  Respiratory: Denied pulmonary symptoms, asthma, pleuritic pain, productive sputum, cough, dyspnea and wheezing.  Gastrointestinal: Denied,  gastro-esophageal reflux, melena, nausea and vomiting.  Genitourinary: Denied genitourinary symptoms including symptomatic vaginal discharge, pelvic relaxation issues, and urinary complaints.  Musculoskeletal: Denied musculoskeletal symptoms, stiffness, swelling, muscle weakness and myalgia.  Dermatologic: Denied dermatology symptoms, rash and scar.  Neurologic: Denied neurology symptoms, dizziness, headache, neck pain and syncope.  Psychiatric: Denied psychiatric symptoms, anxiety and depression.  Endocrine: Denied endocrine symptoms including hot flashes and night sweats.   Meds:   Current Outpatient Medications on File Prior to Visit  Medication Sig Dispense Refill  . levonorgestrel (MIRENA) 20 MCG/24HR IUD 1 each by Intrauterine route once.    . Naftifine HCl 1 % GEL Apply 1 application topically as needed. (Patient not taking: Reported on 08/27/2019) 90 g 2  . valACYclovir (VALTREX) 500 MG tablet Take 1 tablet (500 mg total) by mouth 2 (two) times daily. 60 tablet 4   No current facility-administered medications on file prior to visit.    Objective:     Vitals:   09/24/19 1419  BP: 128/75  Pulse: 89              Physical examination   Pelvic:   Vulva: Normal appearance.  No lesions.  Vagina: No lesions or abnormalities noted.  Support: Normal pelvic support.  Urethra No masses tenderness or scarring.  Meatus Normal size without lesions or prolapse.  Cervix: Normal appearance.  No lesions. IUD strings noted at cervical os.  Anus: Normal exam.  No lesions.  Perineum: Normal exam.  No lesions.        Bimanual   Uterus: Normal size.  Non-tender.  Mobile.  AV.  Adnexae: No masses.  Non-tender to palpation.  Cul-de-sac: Negative for abnormality.     Assessment:    [id number] There are no problems to display for this patient.    1. Surveillance of previously prescribed intrauterine contraceptive device     No problems.   Plan:            1.  Patient to follow-up  for routine care and annual examination. Orders No orders of the defined types were placed in this encounter.   No orders of the defined types were placed in this encounter.     F/U  Return for Annual Physical.  Finis Bud, M.D. 09/24/2019 2:37 PM

## 2020-03-03 ENCOUNTER — Encounter: Payer: BC Managed Care – PPO | Admitting: Obstetrics and Gynecology

## 2020-03-10 ENCOUNTER — Encounter: Payer: BC Managed Care – PPO | Admitting: Obstetrics and Gynecology

## 2020-04-06 ENCOUNTER — Other Ambulatory Visit: Payer: Self-pay

## 2020-04-06 ENCOUNTER — Ambulatory Visit (INDEPENDENT_AMBULATORY_CARE_PROVIDER_SITE_OTHER): Payer: BC Managed Care – PPO | Admitting: Obstetrics and Gynecology

## 2020-04-06 ENCOUNTER — Encounter: Payer: Self-pay | Admitting: Obstetrics and Gynecology

## 2020-04-06 VITALS — BP 114/69 | HR 84 | Ht 68.0 in | Wt 143.2 lb

## 2020-04-06 DIAGNOSIS — Z01419 Encounter for gynecological examination (general) (routine) without abnormal findings: Secondary | ICD-10-CM

## 2020-04-06 NOTE — Progress Notes (Signed)
HPI:      [name] is a 44 y.o. [id number] who LMP was No LMP recorded. (Menstrual status: IUD).  Subjective:   She presents today for her annual examination.  She has no complaints.  She is happy with her IUD.  She is not having menses. She is not fasting at this time and will return for blood work in the near future.    Hx: The following portions of the patient's history were reviewed and updated as appropriate:             She  has a past medical history of HSV infection. She does not have a problem list on file. She  has a past surgical history that includes Cesarean section (1992); Tubal ligation (2003); Breast cyst aspiration (Right, 2007); and Breast biopsy (Right, 04/13/2017). Her family history includes Breast cancer in her paternal aunt; Breast cancer (age of onset: 46) in her maternal grandmother; Cancer in her maternal grandfather; Cirrhosis in her father; Hypertension in her father. She  reports that she has been smoking cigarettes. She has a 10.00 pack-year smoking history. She has never used smokeless tobacco. She reports current alcohol use. She reports that she does not use drugs. She has a current medication list which includes the following prescription(s): levonorgestrel and valacyclovir. She has No Known Allergies.       Review of Systems:  Review of Systems  Constitutional: Denied constitutional symptoms, night sweats, recent illness, fatigue, fever, insomnia and weight loss.  Eyes: Denied eye symptoms, eye pain, photophobia, vision change and visual disturbance.  Ears/Nose/Throat/Neck: Denied ear, nose, throat or neck symptoms, hearing loss, nasal discharge, sinus congestion and sore throat.  Cardiovascular: Denied cardiovascular symptoms, arrhythmia, chest pain/pressure, edema, exercise intolerance, orthopnea and palpitations.  Respiratory: Denied pulmonary symptoms, asthma, pleuritic pain, productive sputum, cough, dyspnea and wheezing.  Gastrointestinal:  Denied, gastro-esophageal reflux, melena, nausea and vomiting.  Genitourinary: Denied genitourinary symptoms including symptomatic vaginal discharge, pelvic relaxation issues, and urinary complaints.  Musculoskeletal: Denied musculoskeletal symptoms, stiffness, swelling, muscle weakness and myalgia.  Dermatologic: Denied dermatology symptoms, rash and scar.  Neurologic: Denied neurology symptoms, dizziness, headache, neck pain and syncope.  Psychiatric: Denied psychiatric symptoms, anxiety and depression.  Endocrine: Denied endocrine symptoms including hot flashes and night sweats.   Meds:   Current Outpatient Medications on File Prior to Visit  Medication Sig Dispense Refill  . levonorgestrel (MIRENA) 20 MCG/24HR IUD 1 each by Intrauterine route once.    . valACYclovir (VALTREX) 500 MG tablet Take 1 tablet (500 mg total) by mouth 2 (two) times daily. 60 tablet 4   No current facility-administered medications on file prior to visit.    Objective:     Vitals:   04/06/20 1427  BP: 114/69  Pulse: 84              Physical examination General NAD, Conversant  HEENT Atraumatic; Op clear with mmm.  Normo-cephalic. Pupils reactive. Anicteric sclerae  Thyroid/Neck Smooth without nodularity or enlargement. Normal ROM.  Neck Supple.  Skin No rashes, lesions or ulceration. Normal palpated skin turgor. No nodularity.  Breasts: No masses or discharge.  Symmetric.  No axillary adenopathy.  Lungs: Clear to auscultation.No rales or wheezes. Normal Respiratory effort, no retractions.  Heart: NSR.  No murmurs or rubs appreciated. No periferal edema  Abdomen: Soft.  Non-tender.  No masses.  No HSM. No hernia  Extremities: Moves all appropriately.  Normal ROM for age. No lymphadenopathy.  Neuro: Oriented to PPT.  Normal mood. Normal affect.     Pelvic:   Vulva: Normal appearance.  No lesions.  Vagina: No lesions or abnormalities noted.  Support: Normal pelvic support.  Urethra No masses  tenderness or scarring.  Meatus Normal size without lesions or prolapse.  Cervix: Normal appearance.  No lesions.  IUD strings noted at cervical os  Anus: Normal exam.  No lesions.  Perineum: Normal exam.  No lesions.        Bimanual   Uterus: Normal size.  Non-tender.  Mobile.  AV.  Adnexae: No masses.  Non-tender to palpation.  Cul-de-sac: Negative for abnormality.      Assessment:    [id number] There are no problems to display for this patient.    1. Well woman exam with routine gynecological exam     Normal exam  IUD without problem   Plan:            1.  Basic Screening Recommendations The basic screening recommendations for asymptomatic women were discussed with the patient during her visit.  The age-appropriate recommendations were discussed with her and the rational for the tests reviewed.  When I am informed by the patient that another primary care physician has previously obtained the age-appropriate tests and they are up-to-date, only outstanding tests are ordered and referrals given as necessary.  Abnormal results of tests will be discussed with her when all of her results are completed.  Routine preventative health maintenance measures emphasized: Exercise/Diet/Weight control, Tobacco Warnings, Alcohol/Substance use risks and Stress Management Mammogram ordered for January Patient will return for fasting morning blood work Orders Orders Placed This Encounter  Procedures  . MM DIGITAL SCREENING BILATERAL    No orders of the defined types were placed in this encounter.           F/U  Return in about 1 year (around 04/06/2021) for Annual Physical.  Elonda Husky, M.D. 04/06/2020 3:01 PM

## 2020-04-09 ENCOUNTER — Other Ambulatory Visit: Payer: Self-pay

## 2020-04-09 ENCOUNTER — Other Ambulatory Visit: Payer: BC Managed Care – PPO

## 2020-04-09 DIAGNOSIS — Z01419 Encounter for gynecological examination (general) (routine) without abnormal findings: Secondary | ICD-10-CM | POA: Diagnosis not present

## 2020-04-10 LAB — BASIC METABOLIC PANEL
BUN/Creatinine Ratio: 26 — ABNORMAL HIGH (ref 9–23)
BUN: 18 mg/dL (ref 6–24)
CO2: 26 mmol/L (ref 20–29)
Calcium: 9.5 mg/dL (ref 8.7–10.2)
Chloride: 104 mmol/L (ref 96–106)
Creatinine, Ser: 0.7 mg/dL (ref 0.57–1.00)
GFR calc Af Amer: 120 mL/min/{1.73_m2} (ref >59)
GFR calc non Af Amer: 104 mL/min/{1.73_m2} (ref >59)
Glucose: 75 mg/dL (ref 65–99)
Potassium: 5.5 mmol/L — ABNORMAL HIGH (ref 3.5–5.2)
Sodium: 143 mmol/L (ref 134–144)

## 2020-04-10 LAB — CHOLESTEROL, TOTAL: Cholesterol, Total: 195 mg/dL (ref 100–199)

## 2020-04-10 LAB — TSH: TSH: 0.905 u[IU]/mL (ref 0.450–4.500)

## 2020-04-10 LAB — CBC
Hematocrit: 41 % (ref 34.0–46.6)
Hemoglobin: 13.6 g/dL (ref 11.1–15.9)
MCH: 32.5 pg (ref 26.6–33.0)
MCHC: 33.2 g/dL (ref 31.5–35.7)
MCV: 98 fL — ABNORMAL HIGH (ref 79–97)
Platelets: 268 10*3/uL (ref 150–450)
RBC: 4.18 x10E6/uL (ref 3.77–5.28)
RDW: 12.6 % (ref 11.7–15.4)
WBC: 9.6 10*3/uL (ref 3.4–10.8)

## 2020-06-29 ENCOUNTER — Other Ambulatory Visit: Payer: Self-pay | Admitting: Obstetrics and Gynecology

## 2020-08-18 ENCOUNTER — Other Ambulatory Visit: Payer: Self-pay

## 2020-08-18 ENCOUNTER — Ambulatory Visit
Admission: RE | Admit: 2020-08-18 | Discharge: 2020-08-18 | Disposition: A | Discharge status: Home / Self Care | Payer: BC Managed Care – PPO | Source: Ambulatory Visit | Attending: Obstetrics and Gynecology | Admitting: Obstetrics and Gynecology

## 2020-08-18 DIAGNOSIS — Z1231 Encounter for screening mammogram for malignant neoplasm of breast: Secondary | ICD-10-CM | POA: Insufficient documentation

## 2020-08-18 DIAGNOSIS — Z01419 Encounter for gynecological examination (general) (routine) without abnormal findings: Secondary | ICD-10-CM

## 2020-09-16 DIAGNOSIS — B351 Tinea unguium: Secondary | ICD-10-CM | POA: Diagnosis not present

## 2020-09-16 DIAGNOSIS — B353 Tinea pedis: Secondary | ICD-10-CM | POA: Diagnosis not present

## 2021-02-08 ENCOUNTER — Telehealth: Payer: BC Managed Care – PPO | Admitting: Obstetrics and Gynecology

## 2021-02-08 MED ORDER — VALACYCLOVIR HCL 500 MG PO TABS: 500.0000 mg | ORAL_TABLET | Freq: Two times a day (BID) | ORAL | 1 refills | Status: DC

## 2021-02-08 NOTE — Telephone Encounter (Signed)
Pt is requesting a refill on Valtrex be sent to her pharmacy- confirmed as [facility] in [place]. Please Advise.

## 2021-02-08 NOTE — Telephone Encounter (Signed)
RX sent to the pharmacy and notified patient that it has been sent in.

## 2021-04-11 NOTE — Patient Instructions (Signed)
Breast Self-Awareness Breast self-awareness is knowing how your breasts look and feel. Doing breast self-awareness is important. It allows you to catch a breast problem early while it is still small and can be treated. All women should do breast self-awareness, including women who have had breast implants. Tell your doctor if you notice a change in your breasts. What you need: A mirror. A well-lit room. How to do a breast self-exam A breast self-exam is one way to learn what is normal for your breasts and to check for changes. To do a breast self-exam: Look for changes  Take off all the clothes above your waist. Stand in front of a mirror in a room with good lighting. Put your hands on your hips. Push your hands down. Look at your breasts and nipples in the mirror to see if one breast or nipple looks different from the other. Check to see if: The shape of one breast is different. The size of one breast is different. There are wrinkles, dips, and bumps in one breast and not the other. Look at each breast for changes in the skin, such as: Redness. Scaly areas. Look for changes in your nipples, such as: Liquid around the nipples. Bleeding. Dimpling. Redness. A change in where the nipples are. Feel for changes  Lie on your back on the floor. Feel each breast. To do this, follow these steps: Pick a breast to feel. Put the arm closest to that breast above your head. Use your other arm to feel the nipple area of your breast. Feel the area with the pads of your three middle fingers by making small circles with your fingers. For the first circle, press lightly. For the second circle, press harder. For the third circle, press even harder. Keep making circles with your fingers at the different pressures as you move down your breast. Stop when you feel your ribs. Move your fingers a little toward the center of your body. Start making circles with your fingers again, this time going up until  you reach your collarbone. Keep making up-and-down circles until you reach your armpit. Remember to keep using the three pressures. Feel the other breast in the same way. Sit or stand in the tub or shower. With soapy water on your skin, feel each breast the same way you did in step 2 when you were lying on the floor. Write down what you find Writing down what you find can help you remember what to tell your doctor. Write down: What is normal for each breast. Any changes you find in each breast, including: The kind of changes you find. Whether you have pain. Size and location of any lumps. When you last had your menstrual period. General tips Check your breasts every month. If you are breastfeeding, the best time to check your breasts is after you feed your baby or after you use a breast pump. If you get menstrual periods, the best time to check your breasts is 5-7 days after your menstrual period is over. With time, you will become comfortable with the self-exam, and you will begin to know if there are changes in your breasts. Contact a doctor if you: See a change in the shape or size of your breasts or nipples. See a change in the skin of your breast or nipples, such as red or scaly skin. Have fluid coming from your nipples that is not normal. Find a lump or thick area that was not there before. Have pain in   your breasts. Have any concerns about your breast health. Summary Breast self-awareness includes looking for changes in your breasts, as well as feeling for changes within your breasts. Breast self-awareness should be done in front of a mirror in a well-lit room. You should check your breasts every month. If you get menstrual periods, the best time to check your breasts is 5-7 days after your menstrual period is over. Let your doctor know of any changes you see in your breasts, including changes in size, changes on the skin, pain or tenderness, or fluid from your nipples that is not  normal. This information is not intended to replace advice given to you by your health care provider. Make sure you discuss any questions you have with your health care provider. Document Revised: 03/05/2018 Document Reviewed: 03/05/2018 Elsevier Patient Education  2022 [employer]      Preventive Care 71-3 Years Old, Female Preventive care refers to lifestyle choices and visits with your health care provider that can promote health and wellness. This includes: A yearly physical exam. This is also called an annual wellness visit. Regular dental and eye exams. Immunizations. Screening for certain conditions. Healthy lifestyle choices, such as: Eating a healthy diet. Getting regular exercise. Not using drugs or products that contain nicotine and tobacco. Limiting alcohol use. What can I expect for my preventive care visit? Physical exam Your health care provider will check your: Height and weight. These may be used to calculate your BMI (body mass index). BMI is a measurement that tells if you are at a healthy weight. Heart rate and blood pressure. Body temperature. Skin for abnormal spots. Counseling Your health care provider may ask you questions about your: Past medical problems. Family's medical history. Alcohol, tobacco, and drug use. Emotional well-being. Home life and relationship well-being. Sexual activity. Diet, exercise, and sleep habits. Work and work Statistician. Access to firearms. Method of birth control. Menstrual cycle. Pregnancy history. What immunizations do I need? Vaccines are usually given at various ages, according to a schedule. Your health care provider will recommend vaccines for you based on your age, medical history, and lifestyle or other factors, such as travel or where you work. What tests do I need? Blood tests Lipid and cholesterol levels. These may be checked every 5 years, or more often if you are over 21 years old. Hepatitis C  test. Hepatitis B test. Screening Lung cancer screening. You may have this screening every year starting at age 49 if you have a 30-pack-year history of smoking and currently smoke or have quit within the past 15 years. Colorectal cancer screening. All adults should have this screening starting at age 4 and continuing until age 7. Your health care provider may recommend screening at age 51 if you are at increased risk. You will have tests every 1-10 years, depending on your results and the type of screening test. Diabetes screening. This is done by checking your blood sugar (glucose) after you have not eaten for a while (fasting). You may have this done every 1-3 years. Mammogram. This may be done every 1-2 years. Talk with your health care provider about when you should start having regular mammograms. This may depend on whether you have a family history of breast cancer. BRCA-related cancer screening. This may be done if you have a family history of breast, ovarian, tubal, or peritoneal cancers. Pelvic exam and Pap test. This may be done every 3 years starting at age 37. Starting at age 67, this may be  done every 5 years if you have a Pap test in combination with an HPV test. Other tests STD (sexually transmitted disease) testing, if you are at risk. Bone density scan. This is done to screen for osteoporosis. You may have this scan if you are at high risk for osteoporosis. Talk with your health care provider about your test results, treatment options, and if necessary, the need for more tests. Follow these instructions at home: Eating and drinking  Eat a diet that includes fresh fruits and vegetables, whole grains, lean protein, and low-fat dairy products. Take vitamin and mineral supplements as recommended by your health care provider. Do not drink alcohol if: Your health care provider tells you not to drink. You are pregnant, may be pregnant, or are planning to become pregnant. If  you drink alcohol: Limit how much you have to 0-1 drink a day. Be aware of how much alcohol is in your drink. In the [place] one drink equals one 12 oz bottle of beer (355 mL), one 5 oz glass of wine (148 mL), or one 1 oz glass of hard liquor (44 mL). Lifestyle Take daily care of your teeth and gums. Brush your teeth every morning and night with fluoride toothpaste. Floss one time each day. Stay active. Exercise for at least 30 minutes 5 or more days each week. Do not use any products that contain nicotine or tobacco, such as cigarettes, e-cigarettes, and chewing tobacco. If you need help quitting, ask your health care provider. Do not use drugs. If you are sexually active, practice safe sex. Use a condom or other form of protection to prevent STIs (sexually transmitted infections). If you do not wish to become pregnant, use a form of birth control. If you plan to become pregnant, see your health care provider for a prepregnancy visit. If told by your health care provider, take low-dose aspirin daily starting at age 41. Find healthy ways to cope with stress, such as: Meditation, yoga, or listening to music. Journaling. Talking to a trusted person. Spending time with friends and family. Safety Always wear your seat belt while driving or riding in a vehicle. Do not drive: If you have been drinking alcohol. Do not ride with someone who has been drinking. When you are tired or distracted. While texting. Wear a helmet and other protective equipment during sports activities. If you have firearms in your house, make sure you follow all gun safety procedures. What's next? Visit your health care provider once a year for an annual wellness visit. Ask your health care provider how often you should have your eyes and teeth checked. Stay up to date on all vaccines. This information is not intended to replace advice given to you by your health care provider. Make sure you discuss any questions you have  with your health care provider. Document Revised: 09/24/2020 Document Reviewed: 03/28/2018 Elsevier Patient Education  2022 [employer].

## 2021-04-12 ENCOUNTER — Ambulatory Visit (INDEPENDENT_AMBULATORY_CARE_PROVIDER_SITE_OTHER): Payer: BC Managed Care – PPO | Admitting: Obstetrics and Gynecology

## 2021-04-12 ENCOUNTER — Encounter: Payer: Self-pay | Admitting: Obstetrics and Gynecology

## 2021-04-12 ENCOUNTER — Other Ambulatory Visit: Payer: Self-pay

## 2021-04-12 VITALS — BP 151/79 | HR 94 | Ht 68.0 in | Wt 138.3 lb

## 2021-04-12 DIAGNOSIS — R102 Pelvic and perineal pain unspecified side: Secondary | ICD-10-CM

## 2021-04-12 DIAGNOSIS — Z01419 Encounter for gynecological examination (general) (routine) without abnormal findings: Secondary | ICD-10-CM

## 2021-04-12 MED ORDER — VALACYCLOVIR HCL 500 MG PO TABS: 500.0000 mg | ORAL_TABLET | Freq: Two times a day (BID) | ORAL | 11 refills | Status: DC

## 2021-04-12 NOTE — Progress Notes (Signed)
HPI:      [name] is a 45 y.o. [id number] who LMP was No LMP recorded (lmp unknown). (Menstrual status: IUD).  Subjective:   She presents today for her annual examination.  She generally is doing well but states that 1-2 times per month when she first gets up in the morning she has some lightheadedness that quickly resolves.  Her mother has a history of hyperglycemia with similar symptoms. [place] also occasionally has bilateral right and left lower quadrant pain at random times.  The pain is not disabling. She has an IUD and is not having menstrual periods.    Hx: The following portions of the patient's history were reviewed and updated as appropriate:             She  has a past medical history of HSV infection. She does not have a problem list on file. She  has a past surgical history that includes Cesarean section (1992); Tubal ligation (2003); Breast cyst aspiration (Right, 2007); and Breast biopsy (Right, 04/13/2017). Her family history includes Breast cancer in her paternal aunt; Breast cancer (age of onset: 41) in her maternal grandmother; Cancer in her maternal grandfather; Cirrhosis in her father; Hypertension in her father. She  reports that she has been smoking cigarettes. She has a 10.00 pack-year smoking history. She has never used smokeless tobacco. She reports current alcohol use. She reports that she does not use drugs. She has a current medication list which includes the following prescription(s): levonorgestrel and valacyclovir. She has No Known Allergies.       Review of Systems:  Review of Systems  Constitutional: Denied constitutional symptoms, night sweats, recent illness, fatigue, fever, insomnia and weight loss.  Eyes: Denied eye symptoms, eye pain, photophobia, vision change and visual disturbance.  Ears/Nose/Throat/Neck: Denied ear, nose, throat or neck symptoms, hearing loss, nasal discharge, sinus congestion and sore throat.  Cardiovascular: Denied  cardiovascular symptoms, arrhythmia, chest pain/pressure, edema, exercise intolerance, orthopnea and palpitations.  Respiratory: Denied pulmonary symptoms, asthma, pleuritic pain, productive sputum, cough, dyspnea and wheezing.  Gastrointestinal: Denied, gastro-esophageal reflux, melena, nausea and vomiting.  Genitourinary:\ Denied genitourinary symptoms including symptomatic vaginal discharge, pelvic relaxation issues, and urinary complaints.  Musculoskeletal: Denied musculoskeletal symptoms, stiffness, swelling, muscle weakness and myalgia.  Dermatologic: Denied dermatology symptoms, rash and scar.  Neurologic: Denied neurology symptoms, dizziness, headache, neck pain and syncope.  Psychiatric: Denied psychiatric symptoms, anxiety and depression.  Endocrine: Denied endocrine symptoms including hot flashes and night sweats.   Meds:   Current Outpatient Medications on File Prior to Visit  Medication Sig Dispense Refill   levonorgestrel (MIRENA) 20 MCG/24HR IUD 1 each by Intrauterine route once.     No current facility-administered medications on file prior to visit.     Objective:     Vitals:   04/12/21 1427  BP: (!) 151/79  Pulse: 94    Filed Weights   04/12/21 1427  Weight: 138 lb 4.8 oz (62.7 kg)              Physical examination General NAD, Conversant  HEENT Atraumatic; Op clear with mmm.  Normo-cephalic. Pupils reactive. Anicteric sclerae  Thyroid/Neck Smooth without nodularity or enlargement. Normal ROM.  Neck Supple.  Skin No rashes, lesions or ulceration. Normal palpated skin turgor. No nodularity.  Breasts: No masses or discharge.  Symmetric.  No axillary adenopathy.  Lungs: Clear to auscultation.No rales or wheezes. Normal Respiratory effort, no retractions.  Heart: NSR.  No murmurs or rubs appreciated. No  periferal edema  Abdomen: Soft.  Non-tender.  No masses.  No HSM. No hernia  Extremities: Moves all appropriately.  Normal ROM for age. No lymphadenopathy.   Neuro: Oriented to PPT.  Normal mood. Normal affect.     Pelvic:   Vulva: Normal appearance.  No lesions.  Vagina: No lesions or abnormalities noted.  Support: Normal pelvic support.  Urethra No masses tenderness or scarring.  Meatus Normal size without lesions or prolapse.  Cervix: Normal appearance.  No lesions.  Anus: Normal exam.  No lesions.  Perineum: Normal exam.  No lesions.        Bimanual   Uterus: Normal size.  Non-tender.  Mobile.  AV.  Adnexae: No masses.  Non-tender to palpation.  Cul-de-sac: Negative for abnormality.     Assessment:    [id number] There are no problems to display for this patient.    1. Well woman exam with routine gynecological exam     Possible hypoglycemic symptoms but rare.   Plan:            1.  Basic Screening Recommendations The basic screening recommendations for asymptomatic women were discussed with the patient during her visit.  The age-appropriate recommendations were discussed with her and the rational for the tests reviewed.  When I am informed by the patient that another primary care physician has previously obtained the age-appropriate tests and they are up-to-date, only outstanding tests are ordered and referrals given as necessary.  Abnormal results of tests will be discussed with her when all of her results are completed.  Routine preventative health maintenance measures emphasized: Exercise/Diet/Weight control, Tobacco Warnings, Alcohol/Substance use risks and Stress Management Mammogram ordered for January-Pap next year-patient to return when fasting for blood work. 2.  Hyperglycemia discussed. 3.  Patient states that 19 years ago she had a small cyst on her ovary and is concerned that might be the cause of her pelvic pain.-Ultrasound ordered.  We have discussed ovarian cysts and other causes of intermittent pelvic pain.  Orders Orders Placed This Encounter  Procedures   MM DIGITAL SCREENING BILATERAL   Basic metabolic panel    Hemoglobin A1c   Lipid panel   TSH   CBC   Cologuard     Meds ordered this encounter  Medications   valACYclovir (VALTREX) 500 MG tablet    Sig: Take 1 tablet (500 mg total) by mouth 2 (two) times daily. As needed    Dispense:  30 tablet    Refill:  11            F/U  Return in about 1 year (around 04/12/2022) for return in 1 year for annual exam , Annual Physical.  Elonda Husky, M.D. 04/12/2021 2:57 PM

## 2021-04-12 NOTE — Progress Notes (Signed)
Pt present for annual exam. Pt stated that she was dong well. Pt declined flu vaccine.

## 2021-04-13 ENCOUNTER — Other Ambulatory Visit: Payer: BC Managed Care – PPO

## 2021-04-13 DIAGNOSIS — Z01419 Encounter for gynecological examination (general) (routine) without abnormal findings: Secondary | ICD-10-CM | POA: Diagnosis not present

## 2021-04-14 LAB — CBC
Hematocrit: 42.7 % (ref 34.0–46.6)
Hemoglobin: 14.4 g/dL (ref 11.1–15.9)
MCH: 32.4 pg (ref 26.6–33.0)
MCHC: 33.7 g/dL (ref 31.5–35.7)
MCV: 96 fL (ref 79–97)
Platelets: 278 10*3/uL (ref 150–450)
RBC: 4.44 x10E6/uL (ref 3.77–5.28)
RDW: 11.8 % (ref 11.7–15.4)
WBC: 7.1 10*3/uL (ref 3.4–10.8)

## 2021-04-14 LAB — LIPID PANEL
Chol/HDL Ratio: 1.9 ratio (ref 0.0–4.4)
Cholesterol, Total: 243 mg/dL — ABNORMAL HIGH (ref 100–199)
HDL: 128 mg/dL (ref >39)
LDL Chol Calc (NIH): 105 mg/dL — ABNORMAL HIGH (ref 0–99)
Triglycerides: 63 mg/dL (ref 0–149)
VLDL Cholesterol Cal: 10 mg/dL (ref 5–40)

## 2021-04-14 LAB — BASIC METABOLIC PANEL
BUN/Creatinine Ratio: 27 — ABNORMAL HIGH (ref 9–23)
BUN: 19 mg/dL (ref 6–24)
CO2: 26 mmol/L (ref 20–29)
Calcium: 9.8 mg/dL (ref 8.7–10.2)
Chloride: 101 mmol/L (ref 96–106)
Creatinine, Ser: 0.7 mg/dL (ref 0.57–1.00)
Glucose: 86 mg/dL (ref 65–99)
Potassium: 5.2 mmol/L (ref 3.5–5.2)
Sodium: 142 mmol/L (ref 134–144)
eGFR: 107 mL/min/{1.73_m2} (ref >59)

## 2021-04-14 LAB — HEMOGLOBIN A1C
Est. average glucose Bld gHb Est-mCnc: 103 mg/dL
Hgb A1c MFr Bld: 5.2 % (ref 4.8–5.6)

## 2021-04-14 LAB — TSH: TSH: 1.49 u[IU]/mL (ref 0.450–4.500)

## 2021-04-18 ENCOUNTER — Other Ambulatory Visit: Payer: BC Managed Care – PPO

## 2021-04-18 ENCOUNTER — Telehealth: Payer: Self-pay | Admitting: Obstetrics and Gynecology

## 2021-04-18 NOTE — Telephone Encounter (Signed)
Called patient to inform her that Dr. Logan Bores wants her to follow up with PCP about lipid panel results. She verbalized understanding.

## 2021-04-18 NOTE — Telephone Encounter (Signed)
Marissa Harrison called in and states she would like a return call from [facility] about her lab results.

## 2021-04-22 ENCOUNTER — Other Ambulatory Visit: Payer: Self-pay

## 2021-04-22 ENCOUNTER — Ambulatory Visit (INDEPENDENT_AMBULATORY_CARE_PROVIDER_SITE_OTHER): Payer: BC Managed Care – PPO

## 2021-04-22 DIAGNOSIS — R102 Pelvic and perineal pain: Secondary | ICD-10-CM

## 2021-05-17 IMAGING — MG DIGITAL SCREENING BILAT W/ TOMO W/ CAD
6 of 11 series · 6 of 31 positions shown · non-contrast
Comparison: Previous exam(s).

CLINICAL DATA: Screening.

EXAM:
DIGITAL SCREENING BILATERAL MAMMOGRAM WITH TOMO AND CAD

[R MLO]
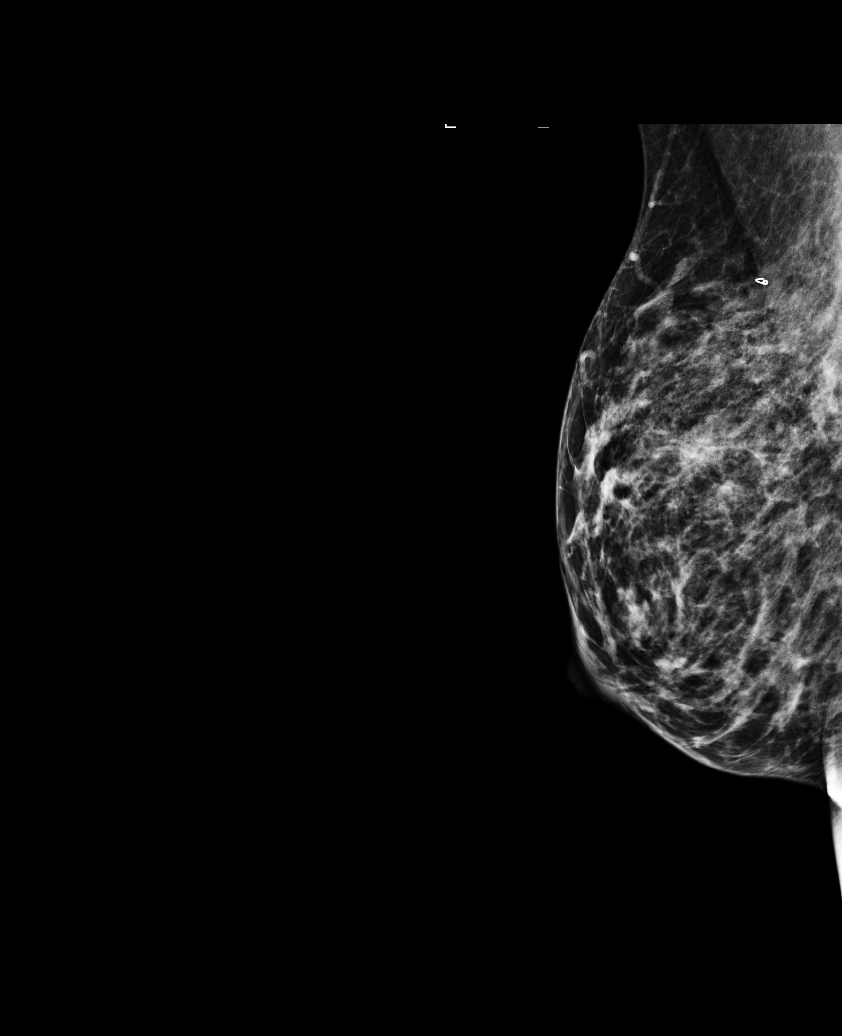

[R XCCL synth-2D]
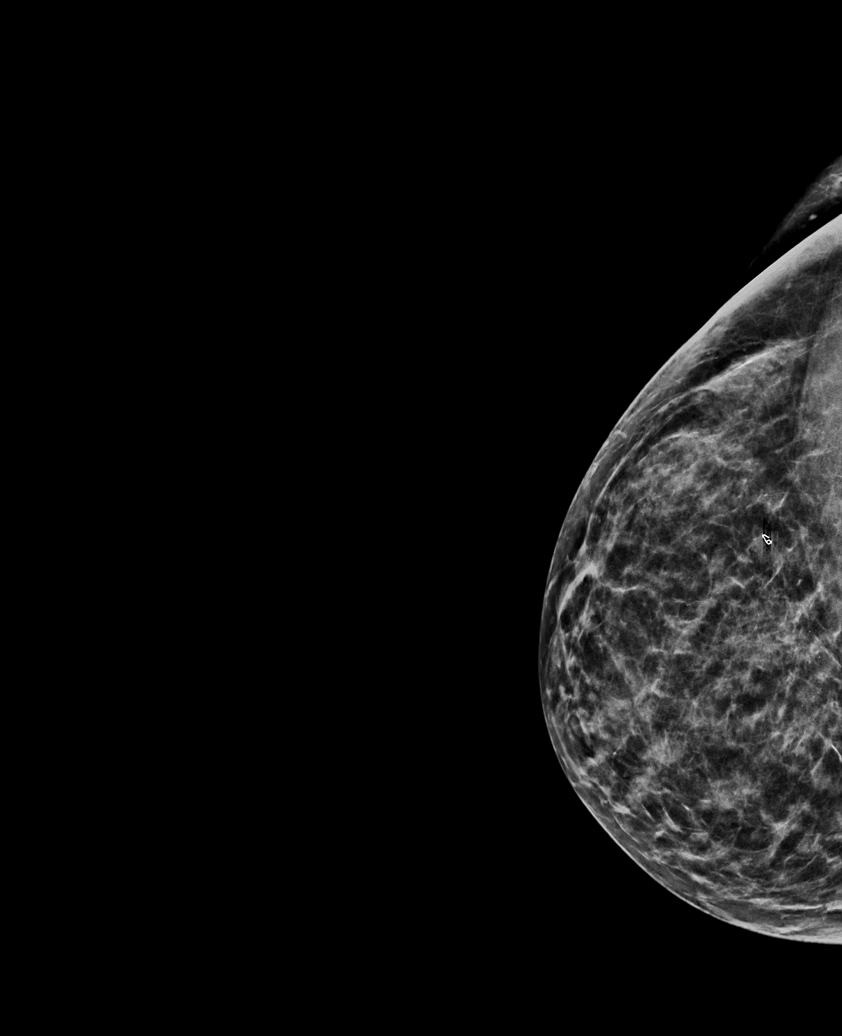

[R CC synth-2D]
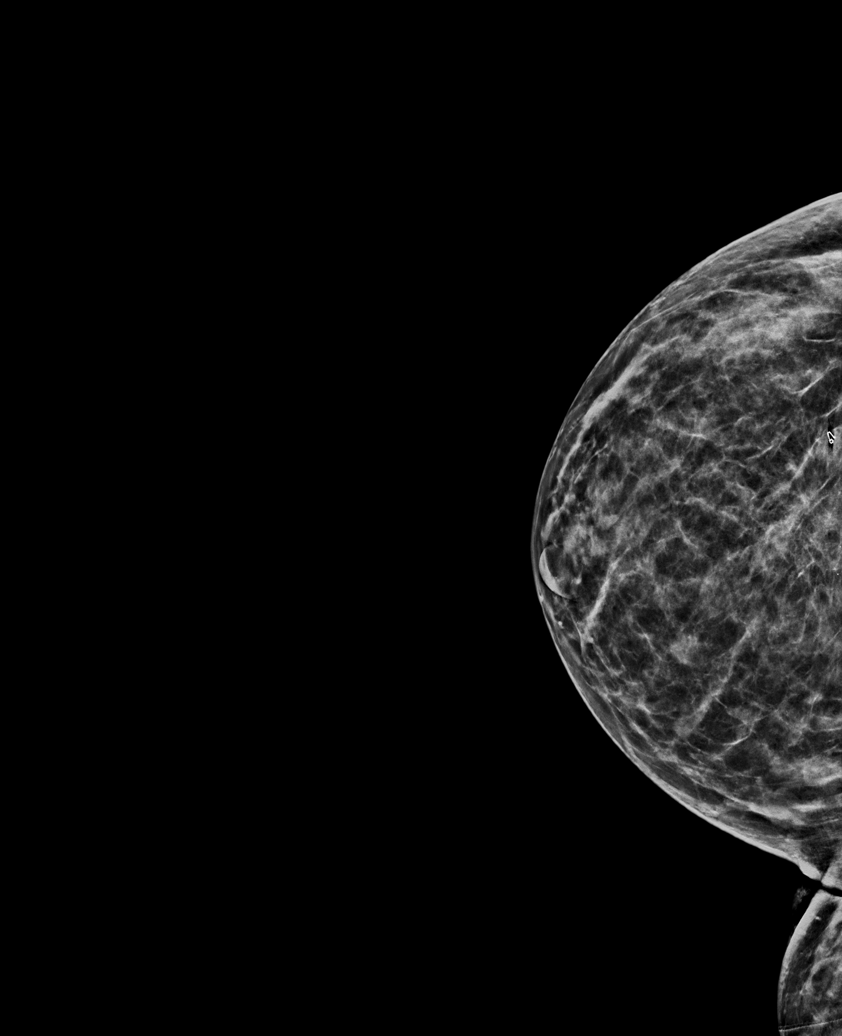

[L CC synth-2D]
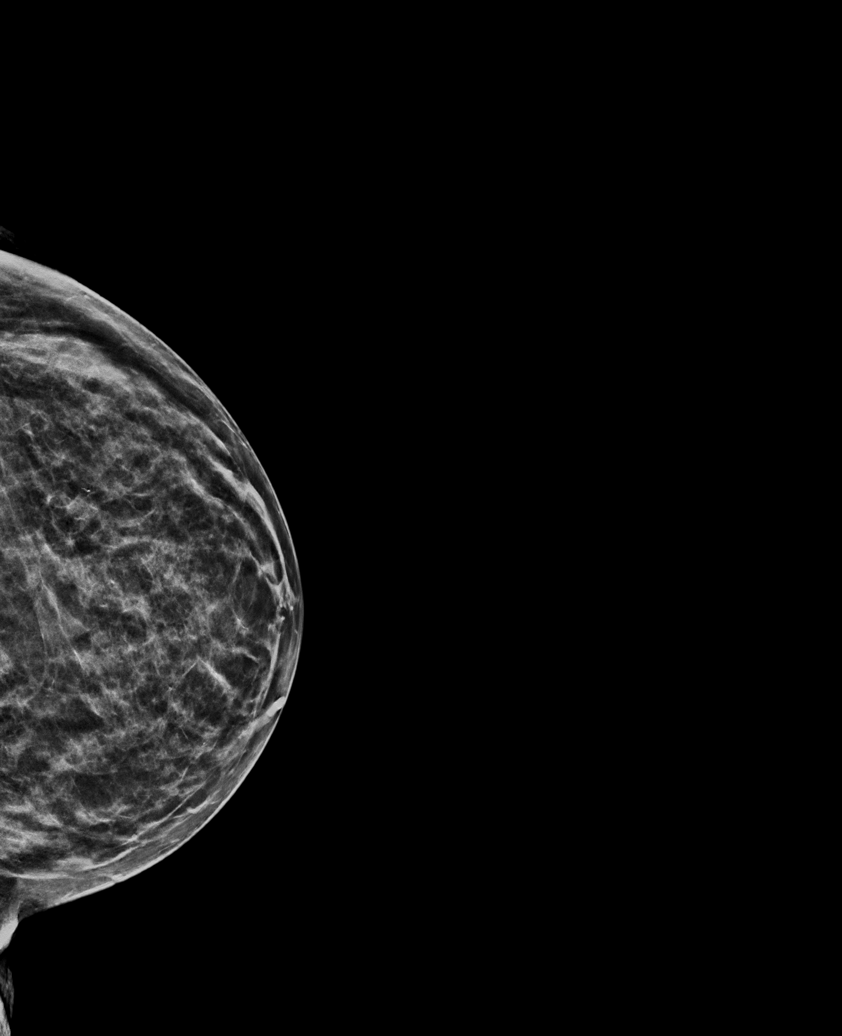

[L MLO synth-2D]
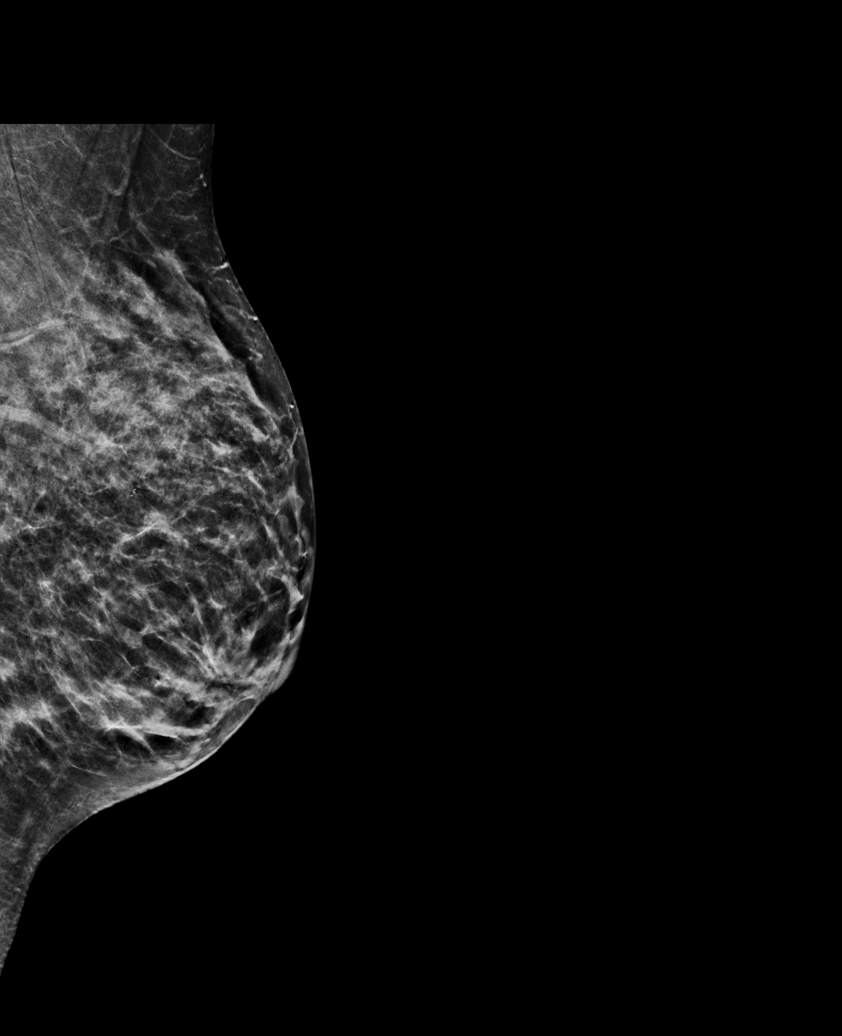

[R MLO synth-2D]
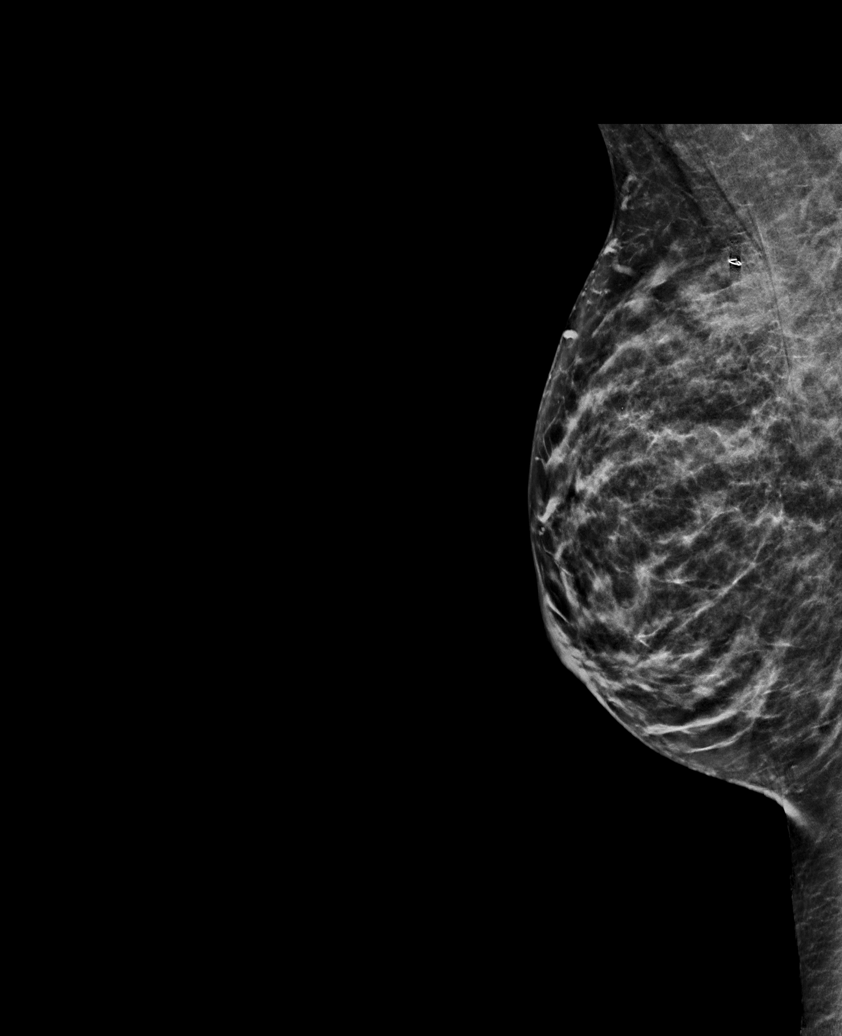

[6 of 31 positions shown; findings below may reference images not displayed]

ACR Breast Density Category c: The breast tissue is heterogeneously
dense, which may obscure small masses.
FINDINGS: There are no findings suspicious for malignancy. Images were
processed with CAD.
IMPRESSION: No mammographic evidence of malignancy. A result letter of this
screening mammogram will be mailed directly to the patient.

RECOMMENDATION:
Screening mammogram in one year. (Code:[id number])

BI-RADS CATEGORY  1: Negative.

## 2021-05-19 DIAGNOSIS — B078 Other viral warts: Secondary | ICD-10-CM | POA: Diagnosis not present

## 2021-05-19 DIAGNOSIS — L538 Other specified erythematous conditions: Secondary | ICD-10-CM | POA: Diagnosis not present

## 2021-07-12 ENCOUNTER — Other Ambulatory Visit: Payer: Self-pay | Admitting: Obstetrics and Gynecology

## 2021-07-12 DIAGNOSIS — Z1231 Encounter for screening mammogram for malignant neoplasm of breast: Secondary | ICD-10-CM

## 2021-08-23 ENCOUNTER — Other Ambulatory Visit: Payer: Self-pay

## 2021-08-23 ENCOUNTER — Ambulatory Visit
Admission: RE | Admit: 2021-08-23 | Discharge: 2021-08-23 | Disposition: A | Discharge status: Home / Self Care | Payer: BC Managed Care – PPO | Source: Ambulatory Visit | Attending: Obstetrics and Gynecology | Admitting: Obstetrics and Gynecology

## 2021-08-23 DIAGNOSIS — Z1231 Encounter for screening mammogram for malignant neoplasm of breast: Secondary | ICD-10-CM | POA: Diagnosis not present

## 2022-02-13 DIAGNOSIS — L03012 Cellulitis of left finger: Secondary | ICD-10-CM | POA: Diagnosis not present

## 2022-03-06 DIAGNOSIS — M79645 Pain in left finger(s): Secondary | ICD-10-CM | POA: Diagnosis not present

## 2022-03-06 DIAGNOSIS — L03012 Cellulitis of left finger: Secondary | ICD-10-CM | POA: Diagnosis not present

## 2022-04-13 ENCOUNTER — Encounter: Payer: BC Managed Care – PPO | Admitting: Obstetrics and Gynecology

## 2022-05-09 DIAGNOSIS — M79641 Pain in right hand: Secondary | ICD-10-CM | POA: Diagnosis not present

## 2022-05-09 DIAGNOSIS — M79631 Pain in right forearm: Secondary | ICD-10-CM | POA: Diagnosis not present

## 2022-05-09 DIAGNOSIS — M25531 Pain in right wrist: Secondary | ICD-10-CM | POA: Diagnosis not present

## 2022-05-09 DIAGNOSIS — S52501A Unspecified fracture of the lower end of right radius, initial encounter for closed fracture: Secondary | ICD-10-CM | POA: Diagnosis not present

## 2022-05-16 DIAGNOSIS — S52501A Unspecified fracture of the lower end of right radius, initial encounter for closed fracture: Secondary | ICD-10-CM | POA: Diagnosis not present

## 2022-05-23 DIAGNOSIS — S52501A Unspecified fracture of the lower end of right radius, initial encounter for closed fracture: Secondary | ICD-10-CM | POA: Diagnosis not present

## 2022-06-12 DIAGNOSIS — S52501A Unspecified fracture of the lower end of right radius, initial encounter for closed fracture: Secondary | ICD-10-CM | POA: Diagnosis not present

## 2022-08-28 ENCOUNTER — Other Ambulatory Visit: Payer: Self-pay | Admitting: Obstetrics and Gynecology

## 2022-08-28 DIAGNOSIS — Z01419 Encounter for gynecological examination (general) (routine) without abnormal findings: Secondary | ICD-10-CM

## 2022-08-29 NOTE — Telephone Encounter (Signed)
Pt calling; has scheduled an annual; can she have refill of valtrex - takes daily.  [phone]

## 2022-09-14 ENCOUNTER — Ambulatory Visit: Payer: BC Managed Care – PPO | Admitting: Obstetrics and Gynecology

## 2022-09-14 DIAGNOSIS — Z01419 Encounter for gynecological examination (general) (routine) without abnormal findings: Secondary | ICD-10-CM

## 2022-09-14 DIAGNOSIS — Z124 Encounter for screening for malignant neoplasm of cervix: Secondary | ICD-10-CM

## 2022-09-14 DIAGNOSIS — Z1231 Encounter for screening mammogram for malignant neoplasm of breast: Secondary | ICD-10-CM

## 2022-10-04 ENCOUNTER — Other Ambulatory Visit: Payer: Self-pay | Admitting: Obstetrics and Gynecology

## 2022-10-04 DIAGNOSIS — Z1231 Encounter for screening mammogram for malignant neoplasm of breast: Secondary | ICD-10-CM

## 2022-10-18 ENCOUNTER — Ambulatory Visit
Admission: RE | Admit: 2022-10-18 | Discharge: 2022-10-18 | Disposition: A | Discharge status: Home / Self Care | Payer: BC Managed Care – PPO | Source: Ambulatory Visit | Attending: Obstetrics and Gynecology | Admitting: Obstetrics and Gynecology

## 2022-10-18 DIAGNOSIS — Z1231 Encounter for screening mammogram for malignant neoplasm of breast: Secondary | ICD-10-CM | POA: Diagnosis not present

## 2022-10-20 ENCOUNTER — Other Ambulatory Visit: Payer: Self-pay | Admitting: Obstetrics and Gynecology

## 2022-10-20 DIAGNOSIS — R928 Other abnormal and inconclusive findings on diagnostic imaging of breast: Secondary | ICD-10-CM

## 2022-10-20 DIAGNOSIS — N6489 Other specified disorders of breast: Secondary | ICD-10-CM

## 2022-11-02 ENCOUNTER — Encounter: Payer: Self-pay | Admitting: Obstetrics and Gynecology

## 2022-11-02 ENCOUNTER — Ambulatory Visit
Admission: RE | Admit: 2022-11-02 | Discharge: 2022-11-02 | Disposition: A | Discharge status: Home / Self Care | Payer: BC Managed Care – PPO | Source: Ambulatory Visit | Attending: Obstetrics and Gynecology | Admitting: Obstetrics and Gynecology

## 2022-11-02 ENCOUNTER — Other Ambulatory Visit: Payer: Self-pay | Admitting: Obstetrics and Gynecology

## 2022-11-02 DIAGNOSIS — N6489 Other specified disorders of breast: Secondary | ICD-10-CM | POA: Insufficient documentation

## 2022-11-02 DIAGNOSIS — R928 Other abnormal and inconclusive findings on diagnostic imaging of breast: Secondary | ICD-10-CM | POA: Diagnosis not present

## 2022-11-07 ENCOUNTER — Encounter: Payer: Self-pay | Admitting: Obstetrics and Gynecology

## 2022-11-14 ENCOUNTER — Ambulatory Visit
Admission: RE | Admit: 2022-11-14 | Discharge: 2022-11-14 | Disposition: A | Discharge status: Home / Self Care | Payer: BC Managed Care – PPO | Source: Ambulatory Visit | Attending: Obstetrics and Gynecology | Admitting: Obstetrics and Gynecology

## 2022-11-14 DIAGNOSIS — R928 Other abnormal and inconclusive findings on diagnostic imaging of breast: Secondary | ICD-10-CM | POA: Insufficient documentation

## 2022-11-14 DIAGNOSIS — N6012 Diffuse cystic mastopathy of left breast: Secondary | ICD-10-CM | POA: Diagnosis not present

## 2022-11-14 DIAGNOSIS — N6324 Unspecified lump in the left breast, lower inner quadrant: Secondary | ICD-10-CM | POA: Diagnosis not present

## 2022-11-14 HISTORY — PX: BREAST BIOPSY: SHX20

## 2022-11-14 MED ORDER — LIDOCAINE-EPINEPHRINE 1 %-1:100000 IJ SOLN
10.0000 mL | Freq: Once | INTRAMUSCULAR | Status: AC
  Administered 2022-11-14: 10 mL via INTRADERMAL

## 2022-11-14 MED ORDER — LIDOCAINE HCL (PF) 1 % IJ SOLN
5.0000 mL | Freq: Once | INTRAMUSCULAR | Status: AC
  Administered 2022-11-14: 5 mL via INTRADERMAL

## 2022-11-15 LAB — SURGICAL PATHOLOGY

## 2022-12-04 DIAGNOSIS — Z0000 Encounter for general adult medical examination without abnormal findings: Secondary | ICD-10-CM | POA: Diagnosis not present

## 2022-12-04 DIAGNOSIS — R432 Parageusia: Secondary | ICD-10-CM | POA: Diagnosis not present

## 2022-12-04 DIAGNOSIS — R430 Anosmia: Secondary | ICD-10-CM | POA: Diagnosis not present

## 2022-12-04 DIAGNOSIS — Z1211 Encounter for screening for malignant neoplasm of colon: Secondary | ICD-10-CM | POA: Diagnosis not present

## 2022-12-04 DIAGNOSIS — Z23 Encounter for immunization: Secondary | ICD-10-CM | POA: Diagnosis not present

## 2022-12-04 DIAGNOSIS — E162 Hypoglycemia, unspecified: Secondary | ICD-10-CM | POA: Diagnosis not present

## 2023-02-19 DIAGNOSIS — L570 Actinic keratosis: Secondary | ICD-10-CM | POA: Diagnosis not present

## 2023-02-19 DIAGNOSIS — L820 Inflamed seborrheic keratosis: Secondary | ICD-10-CM | POA: Diagnosis not present

## 2023-02-19 DIAGNOSIS — D2261 Melanocytic nevi of right upper limb, including shoulder: Secondary | ICD-10-CM | POA: Diagnosis not present

## 2023-02-19 DIAGNOSIS — D225 Melanocytic nevi of trunk: Secondary | ICD-10-CM | POA: Diagnosis not present

## 2023-02-19 DIAGNOSIS — D485 Neoplasm of uncertain behavior of skin: Secondary | ICD-10-CM | POA: Diagnosis not present

## 2023-02-19 DIAGNOSIS — D2262 Melanocytic nevi of left upper limb, including shoulder: Secondary | ICD-10-CM | POA: Diagnosis not present

## 2023-03-05 ENCOUNTER — Other Ambulatory Visit: Payer: Self-pay | Admitting: Obstetrics and Gynecology

## 2023-03-05 DIAGNOSIS — Z01419 Encounter for gynecological examination (general) (routine) without abnormal findings: Secondary | ICD-10-CM

## 2023-03-10 DIAGNOSIS — S29012A Strain of muscle and tendon of back wall of thorax, initial encounter: Secondary | ICD-10-CM | POA: Diagnosis not present

## 2023-03-12 DIAGNOSIS — M47817 Spondylosis without myelopathy or radiculopathy, lumbosacral region: Secondary | ICD-10-CM | POA: Diagnosis not present

## 2023-03-12 DIAGNOSIS — R091 Pleurisy: Secondary | ICD-10-CM | POA: Diagnosis not present

## 2023-03-12 DIAGNOSIS — M5414 Radiculopathy, thoracic region: Secondary | ICD-10-CM | POA: Diagnosis not present

## 2023-03-12 DIAGNOSIS — M5137 Other intervertebral disc degeneration, lumbosacral region: Secondary | ICD-10-CM | POA: Diagnosis not present

## 2023-03-12 DIAGNOSIS — Z885 Allergy status to narcotic agent status: Secondary | ICD-10-CM | POA: Diagnosis not present

## 2023-03-12 DIAGNOSIS — M549 Dorsalgia, unspecified: Secondary | ICD-10-CM | POA: Diagnosis not present

## 2023-03-12 DIAGNOSIS — M546 Pain in thoracic spine: Secondary | ICD-10-CM | POA: Diagnosis not present

## 2023-03-17 DIAGNOSIS — M542 Cervicalgia: Secondary | ICD-10-CM | POA: Diagnosis not present

## 2023-03-21 DIAGNOSIS — H01009 Unspecified blepharitis unspecified eye, unspecified eyelid: Secondary | ICD-10-CM | POA: Diagnosis not present

## 2023-03-30 DIAGNOSIS — H109 Unspecified conjunctivitis: Secondary | ICD-10-CM | POA: Diagnosis not present

## 2023-05-09 ENCOUNTER — Ambulatory Visit: Payer: BC Managed Care – PPO

## 2023-05-09 DIAGNOSIS — K621 Rectal polyp: Secondary | ICD-10-CM | POA: Diagnosis not present

## 2023-05-09 DIAGNOSIS — Z1211 Encounter for screening for malignant neoplasm of colon: Secondary | ICD-10-CM | POA: Diagnosis not present

## 2023-05-09 DIAGNOSIS — K640 First degree hemorrhoids: Secondary | ICD-10-CM | POA: Diagnosis not present

## 2023-05-15 ENCOUNTER — Emergency Department: Payer: BC Managed Care – PPO

## 2023-05-15 ENCOUNTER — Emergency Department
Admission: EM | Admit: 2023-05-15 | Discharge: 2023-05-16 | Disposition: A | Discharge status: Home / Self Care | Payer: BC Managed Care – PPO | Attending: Emergency Medicine | Admitting: Emergency Medicine

## 2023-05-15 ENCOUNTER — Other Ambulatory Visit: Payer: Self-pay

## 2023-05-15 DIAGNOSIS — Y908 Blood alcohol level of 240 mg/100 ml or more: Secondary | ICD-10-CM | POA: Diagnosis not present

## 2023-05-15 DIAGNOSIS — S42322A Displaced transverse fracture of shaft of humerus, left arm, initial encounter for closed fracture: Secondary | ICD-10-CM | POA: Diagnosis not present

## 2023-05-15 DIAGNOSIS — F10129 Alcohol abuse with intoxication, unspecified: Secondary | ICD-10-CM | POA: Diagnosis not present

## 2023-05-15 DIAGNOSIS — S40922A Unspecified superficial injury of left upper arm, initial encounter: Secondary | ICD-10-CM | POA: Diagnosis not present

## 2023-05-15 DIAGNOSIS — R0689 Other abnormalities of breathing: Secondary | ICD-10-CM | POA: Diagnosis not present

## 2023-05-15 DIAGNOSIS — W19XXXA Unspecified fall, initial encounter: Secondary | ICD-10-CM

## 2023-05-15 DIAGNOSIS — Z043 Encounter for examination and observation following other accident: Secondary | ICD-10-CM | POA: Diagnosis not present

## 2023-05-15 DIAGNOSIS — M7989 Other specified soft tissue disorders: Secondary | ICD-10-CM | POA: Diagnosis not present

## 2023-05-15 DIAGNOSIS — W1839XA Other fall on same level, initial encounter: Secondary | ICD-10-CM | POA: Insufficient documentation

## 2023-05-15 DIAGNOSIS — F10920 Alcohol use, unspecified with intoxication, uncomplicated: Secondary | ICD-10-CM | POA: Insufficient documentation

## 2023-05-15 DIAGNOSIS — S42302A Unspecified fracture of shaft of humerus, left arm, initial encounter for closed fracture: Secondary | ICD-10-CM | POA: Diagnosis not present

## 2023-05-15 DIAGNOSIS — Y92094 Garage of other non-institutional residence as the place of occurrence of the external cause: Secondary | ICD-10-CM | POA: Diagnosis not present

## 2023-05-15 DIAGNOSIS — R000 Tachycardia, unspecified: Secondary | ICD-10-CM | POA: Diagnosis not present

## 2023-05-15 LAB — CBC WITH DIFFERENTIAL/PLATELET
Abs Immature Granulocytes: 0.01 10*3/uL (ref 0.00–0.07)
Basophils Absolute: 0.1 10*3/uL (ref 0.0–0.1)
Basophils Relative: 1 %
Eosinophils Absolute: 0.1 10*3/uL (ref 0.0–0.5)
Eosinophils Relative: 1 %
HCT: 37.3 % (ref 36.0–46.0)
Hemoglobin: 12.4 g/dL (ref 12.0–15.0)
Immature Granulocytes: 0 %
Lymphocytes Relative: 37 %
Lymphs Abs: 2.7 10*3/uL (ref 0.7–4.0)
MCH: 33.3 pg (ref 26.0–34.0)
MCHC: 33.2 g/dL (ref 30.0–36.0)
MCV: 100.3 fL — ABNORMAL HIGH (ref 80.0–100.0)
Monocytes Absolute: 0.6 10*3/uL (ref 0.1–1.0)
Monocytes Relative: 8 %
Neutro Abs: 3.9 10*3/uL (ref 1.7–7.7)
Neutrophils Relative %: 53 %
Platelets: 239 10*3/uL (ref 150–400)
RBC: 3.72 MIL/uL — ABNORMAL LOW (ref 3.87–5.11)
RDW: 12.2 % (ref 11.5–15.5)
WBC: 7.4 10*3/uL (ref 4.0–10.5)
nRBC: 0 % (ref 0.0–0.2)

## 2023-05-15 LAB — COMPREHENSIVE METABOLIC PANEL
ALT: 15 U/L (ref 0–44)
AST: 20 U/L (ref 15–41)
Albumin: 4.1 g/dL (ref 3.5–5.0)
Alkaline Phosphatase: 53 U/L (ref 38–126)
Anion gap: 10 (ref 5–15)
BUN: 17 mg/dL (ref 6–20)
CO2: 24 mmol/L (ref 22–32)
Calcium: 8.4 mg/dL — ABNORMAL LOW (ref 8.9–10.3)
Chloride: 102 mmol/L (ref 98–111)
Creatinine, Ser: 0.6 mg/dL (ref 0.44–1.00)
GFR, Estimated: >60 mL/min (ref >60)
Glucose, Bld: 106 mg/dL — ABNORMAL HIGH (ref 70–99)
Potassium: 3.5 mmol/L (ref 3.5–5.1)
Sodium: 136 mmol/L (ref 135–145)
Total Bilirubin: 0.6 mg/dL (ref 0.3–1.2)
Total Protein: 6.8 g/dL (ref 6.5–8.1)

## 2023-05-15 LAB — PROTIME-INR
INR: 1.3 — ABNORMAL HIGH (ref 0.8–1.2)
Prothrombin Time: 15.9 s — ABNORMAL HIGH (ref 11.4–15.2)

## 2023-05-15 LAB — ETHANOL: Alcohol, Ethyl (B): 329 mg/dL (ref <10)

## 2023-05-15 LAB — APTT: aPTT: 22 s — ABNORMAL LOW (ref 24–36)

## 2023-05-15 LAB — HCG, QUANTITATIVE, PREGNANCY: hCG, Beta Chain, Quant, S: 1 m[IU]/mL (ref <5)

## 2023-05-15 MED ORDER — KETAMINE HCL 50 MG/5ML IJ SOSY
PREFILLED_SYRINGE | INTRAMUSCULAR | Status: AC
  Filled 2023-05-15: qty 5

## 2023-05-15 MED ORDER — MORPHINE SULFATE (PF) 4 MG/ML IV SOLN
8.0000 mg | Freq: Once | INTRAVENOUS | Status: AC
  Administered 2023-05-15: 8 mg via INTRAVENOUS
  Filled 2023-05-15: qty 2

## 2023-05-15 MED ORDER — OXYCODONE HCL 5 MG PO TABS
5.0000 mg | ORAL_TABLET | ORAL | 0 refills | Status: DC | PRN
Start: 2023-05-15 — End: 2023-05-19

## 2023-05-15 MED ORDER — KETAMINE HCL 10 MG/ML IJ SOLN
INTRAMUSCULAR | Status: AC | PRN
Start: 2023-05-15 — End: 2023-05-15
  Administered 2023-05-15: 30 mg via INTRAVENOUS
  Administered 2023-05-15: 70 mg via INTRAVENOUS

## 2023-05-15 MED ORDER — KETAMINE HCL 50 MG/5ML IJ SOSY: 1.0000 mg/kg | PREFILLED_SYRINGE | Freq: Once | INTRAMUSCULAR | Status: DC

## 2023-05-15 MED ORDER — FENTANYL CITRATE PF 50 MCG/ML IJ SOSY
100.0000 ug | PREFILLED_SYRINGE | Freq: Once | INTRAMUSCULAR | Status: AC
  Administered 2023-05-15: 100 ug via INTRAVENOUS
  Filled 2023-05-15: qty 2

## 2023-05-15 NOTE — ED Notes (Signed)
Necklace and earrings removed and given to husband.

## 2023-05-15 NOTE — Discharge Instructions (Addendum)
You will see Dr. Carola Frost in his clinic tomorrow (06/16/2023).  Please call their office first thing in the morning to know which time you will be going in at.  I have sent pain medications to your pharmacy to pick up and take as needed.  Please avoid alcohol use while on this medication as it could lead to falls or respiratory decline.  Please do not drive or use other machinery while on this medication.

## 2023-05-15 NOTE — Sedation Documentation (Signed)
Provider talking to family

## 2023-05-15 NOTE — Sedation Documentation (Signed)
Provider placing splint along with tech.

## 2023-05-15 NOTE — ED Notes (Signed)
Applied a large shoulder immobilizer on L arm. Pulses intact

## 2023-05-15 NOTE — Sedation Documentation (Signed)
X-ray at bedside

## 2023-05-15 NOTE — ED Provider Notes (Signed)
[facility] Provider Note    Event Date/Time   First MD Initiated Contact with Patient 05/15/23 2021     (approximate)   History   Fall   HPI Marissa Harrison is a 47 y.o. female presenting today for fall.  Patient reportedly had fall going up the garage and landed on her left arm.  Obvious deformity was noted.  Patient does admit to drinking alcohol tonight.  Unsure injury to head or loss of consciousness.  Denies pain symptoms elsewhere outside the left upper arm.  Husband at bedside reports intermittent episodes of syncope reportedly over the past several years without a true diagnosis.     Physical Exam   Triage Vital Signs: ED Triage Vitals  Encounter Vitals Group     BP 05/15/23 2020 126/79     Systolic BP Percentile --      Diastolic BP Percentile --      Pulse Rate 05/15/23 2019 74     Resp 05/15/23 2020 18     Temp 05/15/23 2020 98.2 F (36.8 C)     Temp Source 05/15/23 2020 Oral     SpO2 05/15/23 2015 94 %     Weight 05/15/23 2025 140 lb (63.5 kg)     Height 05/15/23 2025 5\' 8"  (1.727 m)     Head Circumference --      Peak Flow --      Pain Score 05/15/23 2023 10     Pain Loc --      Pain Education --      Exclude from Growth Chart --     Most recent vital signs: Vitals:   05/15/23 2300 05/15/23 2315  BP: (!) 145/91 133/85  Pulse: 95 86  Resp: 11   Temp:    SpO2: 99% 100%   Physical Exam: I have reviewed the vital signs and nursing notes. General: Awake, alert, intoxicated and in acute pain Head:  Atraumatic, normocephalic.   ENT:  EOM intact, PERRL. Oral mucosa is pink and moist with no lesions. Neck: Neck is supple with full range of motion, No meningeal signs. Cardiovascular:  RRR, No murmurs. Peripheral pulses palpable and equal bilaterally. Respiratory:  Symmetrical chest wall expansion.  No rhonchi, rales, or wheezes.  Good air movement throughout.  No use of accessory muscles.   Musculoskeletal:  No cyanosis or edema.   Noticeable deformity to left upper extremity.  Sensation and radial pulse intact throughout left upper extremity. Abdomen:  Soft, nontender, nondistended. Neuro:  GCS 15, moving all four extremities, interacting appropriately. Speech clear. Psych:  Calm, appropriate.   Skin:  Warm, dry, no rash.     ED Results / Procedures / Treatments   Labs (all labs ordered are listed, but only abnormal results are displayed) Labs Reviewed  CBC WITH DIFFERENTIAL/PLATELET - Abnormal; Notable for the following components:      Result Value   RBC 3.72 (*)    MCV 100.3 (*)    All other components within normal limits  COMPREHENSIVE METABOLIC PANEL - Abnormal; Notable for the following components:   Glucose, Bld 106 (*)    Calcium 8.4 (*)    All other components within normal limits  ETHANOL - Abnormal; Notable for the following components:   Alcohol, Ethyl (B) 329 (*)    All other components within normal limits  APTT - Abnormal; Notable for the following components:   aPTT 22 (*)    All other components within normal limits  PROTIME-INR -  Abnormal; Notable for the following components:   Prothrombin Time 15.9 (*)    INR 1.3 (*)    All other components within normal limits  HCG, QUANTITATIVE, PREGNANCY     EKG    RADIOLOGY Independently interpreted x-ray and CT reads and agree with radiology noting severe angulated and displaced left humerus fracture.   PROCEDURES:  Critical Care performed: No  .Sedation  Date/Time: 05/15/2023 11:38 PM  Performed by: Janith Lima, MD Authorized by: Janith Lima, MD   Consent:    Consent obtained:  Verbal and written   Consent given by:  Patient   Alternatives discussed:  Analgesia without sedation Universal protocol:    Immediately prior to procedure, a time out was called: yes     Patient identity confirmed:  Arm band Indications:    Procedure performed:  Fracture reduction   Procedure necessitating sedation performed by:  Physician  performing sedation Pre-sedation assessment:    Time since last food or drink:  Unknown   NPO status caution: unable to specify NPO status and urgency dictates proceeding with non-ideal NPO status     ASA classification: class 1 - normal, healthy patient     Mouth opening:  3 or more finger widths   Thyromental distance:  3 finger widths   Mallampati score:  I - soft palate, uvula, fauces, pillars visible   Neck mobility: normal     Pre-sedation assessments completed and reviewed: airway patency, cardiovascular function, hydration status, mental status, nausea/vomiting, pain level, respiratory function and temperature   Immediate pre-procedure details:    Reassessment: Patient reassessed immediately prior to procedure     Reviewed: vital signs, relevant labs/tests and NPO status     Verified: bag valve mask available, emergency equipment available, intubation equipment available, IV patency confirmed, oxygen available and suction available   Procedure details (see MAR for exact dosages):    Preoxygenation:  Nasal cannula   Sedation:  Ketamine   Intended level of sedation: deep   Analgesia:  Morphine and fentanyl   Intra-procedure monitoring:  Blood pressure monitoring, cardiac monitor, continuous capnometry, continuous pulse oximetry, frequent LOC assessments and frequent vital sign checks   Intra-procedure events: hypoxia     Intra-procedure management:  Airway repositioning   Total Provider sedation time (minutes):  30 Post-procedure details:    Attendance: Constant attendance by certified staff until patient recovered     Recovery: Patient returned to pre-procedure baseline     Post-sedation assessments completed and reviewed: airway patency, cardiovascular function, hydration status, mental status, nausea/vomiting, pain level, respiratory function and temperature     Patient is stable for discharge or admission: yes     Procedure completion:  Tolerated well, no immediate  complications Reduction of fracture  Date/Time: 05/15/2023 11:41 PM  Performed by: Janith Lima, MD Authorized by: Janith Lima, MD  Consent: Verbal consent obtained. Written consent obtained. Consent given by: patient and spouse Required items: required blood products, implants, devices, and special equipment available Patient identity confirmed: arm band Time out: Immediately prior to procedure a "time out" was called to verify the correct patient, procedure, equipment, support staff and site/side marked as required. Preparation: Patient was prepped and draped in the usual sterile fashion. Local anesthesia used: no  Anesthesia: Local anesthesia used: no  Sedation: Patient sedated: yes Sedation type: moderate (conscious) sedation Sedatives: ketamine Analgesia: fentanyl Vitals: Vital signs were monitored during sedation.  Patient tolerance: patient tolerated the procedure well with no immediate complications Comments: Reduction  of angulated left humerus shaft fracture.      MEDICATIONS ORDERED IN ED: Medications  ketamine 50 mg in normal saline 5 mL (10 mg/mL) syringe (64 mg Intravenous Not Given 05/15/23 2302)  ketamine HCl 50 MG/5ML SOSY (  Not Given 05/15/23 2303)  ketamine HCl 50 MG/5ML SOSY (  Not Given 05/15/23 2302)  fentaNYL (SUBLIMAZE) injection 100 mcg (100 mcg Intravenous Given 05/15/23 2038)  morphine (PF) 4 MG/ML injection 8 mg (8 mg Intravenous Given 05/15/23 2131)  ketamine (KETALAR) injection (30 mg Intravenous Given 05/15/23 2238)     IMPRESSION / MDM / ASSESSMENT AND PLAN / ED COURSE  I reviewed the triage vital signs and the nursing notes.                              Differential diagnosis includes, but is not limited to, alcohol intoxication, left humerus fracture, left shoulder dislocation, ICH, cervical spine injury.  Patient's presentation is most consistent with acute complicated illness / injury requiring diagnostic workup.  Patient is  a 47 year old female presenting today for alcohol intoxication with fall.  Exam notable for obvious left upper arm deformity.  Neurovascular intact distal to this injury.  CT head and C-spine was also ordered given intoxication and unsure head injury.  These resulted as negative.  Laboratory workup largely reassuring otherwise aside from notable alcohol intoxication at 329.  Patient requiring multiple doses of pain medicine.  X-ray shows severely displaced and angulated left humerus fracture.  Discussed case with Dr. Allena Katz who recommends bedside reduction.  Patient was sedated with ketamine and reduced at the bedside by myself.  Postreduction x-rays do show improvement with improved alignment.  Discussed the case with Dr. Allena Katz here as well as Dr. Carola Frost with trauma orthopedics at [facility].  They are happy with the reduction coaptation splint applied at this time.  Recommending follow-up with Dr. Carola Frost tomorrow in the clinic.  No emergent operative repair needed.  Patient neurovascular intact following the procedure.  Patient was signed out to oncoming provider while metabolizing both the ketamine and alcohol before she can be safely discharged home.  The patient is on the cardiac monitor to evaluate for evidence of arrhythmia and/or significant heart rate changes. Clinical Course as of 05/15/23 2337  Tue May 15, 2023  2101 Alcohol, Ethyl (B)(!!): 329 [DW]  2134 Dr. Allena Katz with orthopedics recommends bedside reduction under sedation.  If successfully able to reduce, then she will be placed in a coaptation splint and likely discharge with outpatient follow-up.  Does not need emergent surgery at this time with her neurovascularly intact [DW]  2320 Spoke with orthopedics here with Dr. Allena Katz and Dr. Carola Frost at [facility].  They are satisfied with the reduction and coaptation splint at this time.  Plan for follow-up tomorrow in the clinic with Dr. Carola Frost.  Have her call the office for Singh in the morning to get that  set up. [DW]    Clinical Course User Index [DW] Janith Lima, MD     FINAL CLINICAL IMPRESSION(S) / ED DIAGNOSES   Final diagnoses:  Displaced transverse fracture of shaft of humerus, left arm, initial encounter for closed fracture  Fall, initial encounter  Alcoholic intoxication without complication (HCC)     Rx / DC Orders   ED Discharge Orders          Ordered    oxyCODONE (ROXICODONE) 5 MG immediate release tablet  Every 4  hours PRN        05/15/23 2337             Note:  This document was prepared using Dragon voice recognition software and may include unintentional dictation errors.   Janith Lima, MD 05/15/23 [phone]

## 2023-05-15 NOTE — Sedation Documentation (Signed)
Called X-ray

## 2023-05-15 NOTE — ED Notes (Signed)
Removed 2 rings from left hand, handed to husband at bedside

## 2023-05-15 NOTE — ED Notes (Signed)
Pt to CT an Xray

## 2023-05-15 NOTE — ED Triage Notes (Signed)
PT BIB EMS from s/p fall. Pt was going up the steps in the garage and fell. Denies LOC, headstrike, or blood thinners. Pt has obvious bulging deformity to left humerus. EMS started IV RAC 18G, admin NS and of fentanyl. +ETOH 2 drinks.

## 2023-05-15 NOTE — ED Notes (Addendum)
Family at bedside. 

## 2023-05-15 NOTE — ED Notes (Signed)
Pt back from xr

## 2023-05-16 ENCOUNTER — Encounter (HOSPITAL_COMMUNITY): Payer: Self-pay | Admitting: Orthopedic Surgery

## 2023-05-16 ENCOUNTER — Other Ambulatory Visit: Payer: Self-pay

## 2023-05-16 DIAGNOSIS — S42392A Other fracture of shaft of left humerus, initial encounter for closed fracture: Secondary | ICD-10-CM | POA: Diagnosis not present

## 2023-05-16 MED ORDER — ONDANSETRON 4 MG PO TBDP: 4.0000 mg | ORAL_TABLET | Freq: Three times a day (TID) | ORAL | 0 refills | Status: DC | PRN

## 2023-05-16 MED ORDER — ONDANSETRON HCL 4 MG/2ML IJ SOLN
4.0000 mg | Freq: Once | INTRAMUSCULAR | Status: AC
  Administered 2023-05-16: 4 mg via INTRAVENOUS
  Filled 2023-05-16: qty 2

## 2023-05-16 NOTE — Progress Notes (Addendum)
PCP - Alm Bustard, NP  Cardiologist - denies  PPM/ICD - denies Device Orders - n/a Rep Notified - n/a  Chest x-ray - 03-12-23 EKG - 03-12-23 Stress Test - denies ECHO - denies Cardiac Cath - denies  CPAP - denies  DM- denies  Blood Thinner Instructions: denies Aspirin Instructions: n/a  ERAS Protcol - clear liquids until 5:30  COVID TEST- no  Anesthesia review: no   Patient verbally denies any shortness of breath, fever, cough and chest pain during phone call   -------------  SDW INSTRUCTIONS given:  Your procedure is scheduled on May 17, 2023.  Report to [facility] Main Entrance "A" at 6:00 A.M., and check in at the Admitting office.  Call this number if you have problems the morning of surgery:  [phone]   Remember:  Do not eat after midnight the night before your surgery  You may drink clear liquids until 5:30  the morning of your surgery.   Clear liquids allowed are: Water, Non-Citrus Juices (without pulp), Carbonated Beverages, Clear Tea, Black Coffee Only, and Gatorade    Take these medicines the morning of surgery with A SIP OF WATER    IF NEEDED ondansetron (ZOFRAN-ODT)  oxyCODONE (ROXICODONE)  valACYclovir (VALTREX)   As of today, STOP taking any Aspirin (unless otherwise instructed by your surgeon) Aleve, Naproxen, Ibuprofen, Motrin, Advil, Goody's, BC's, all herbal medications, fish oil, and all vitamins.                      Do not wear jewelry, make up, or nail polish            Do not wear lotions, powders, perfumes/colognes, or deodorant.            Do not shave 48 hours prior to surgery.  Men may shave face and neck.            Do not bring valuables to the hospital.            [facility] is not responsible for any belongings or valuables.  Do NOT Smoke (Tobacco/Vaping) 24 hours prior to your procedure If you use a CPAP at night, you may bring all equipment for your overnight stay.   Contacts, glasses, dentures or bridgework  may not be worn into surgery.      For patients admitted to the hospital, discharge time will be determined by your treatment team.   Patients discharged the day of surgery will not be allowed to drive home, and someone needs to stay with them for 24 hours.    Special instructions:   [place]- Preparing For Surgery  Before surgery, you can play an important role. Because skin is not sterile, your skin needs to be as free of germs as possible. You can reduce the number of germs on your skin by washing with CHG (chlorahexidine gluconate) Soap before surgery.  CHG is an antiseptic cleaner which kills germs and bonds with the skin to continue killing germs even after washing.    Oral Hygiene is also important to reduce your risk of infection.  Remember - BRUSH YOUR TEETH THE MORNING OF SURGERY WITH YOUR REGULAR TOOTHPASTE  Please do not use if you have an allergy to CHG or antibacterial soaps. If your skin becomes reddened/irritated stop using the CHG.  Do not shave (including legs and underarms) for at least 48 hours prior to first CHG shower. It is OK to shave your face.  Please follow  these instructions carefully.   Shower the NIGHT BEFORE SURGERY and the MORNING OF SURGERY with DIAL Soap.   Pat yourself dry with a CLEAN TOWEL.  Wear CLEAN PAJAMAS to bed the night before surgery  Place CLEAN SHEETS on your bed the night of your first shower and DO NOT SLEEP WITH PETS.   Day of Surgery: Please shower morning of surgery  Wear Clean/Comfortable clothing the morning of surgery Do not apply any deodorants/lotions.   Remember to brush your teeth WITH YOUR REGULAR TOOTHPASTE.   Questions were answered. Patient verbalized understanding of instructions.

## 2023-05-16 NOTE — ED Provider Notes (Signed)
Procedures  Clinical Course as of 05/16/23 1610  Tue May 15, 2023  2101 Alcohol, Ethyl (B)(!!): 329 [DW]  2134 Dr. Allena Katz with orthopedics recommends bedside reduction under sedation.  If successfully able to reduce, then she will be placed in a coaptation splint and likely discharge with outpatient follow-up.  Does not need emergent surgery at this time with her neurovascularly intact [DW]  2320 Spoke with orthopedics here with Dr. Allena Katz and Dr. Carola Frost at [facility].  They are satisfied with the reduction and coaptation splint at this time.  Plan for follow-up tomorrow in the clinic with Dr. Carola Frost.  Have her call the office for Singh in the morning to get that set up. [DW]    Clinical Course User Index [DW] Janith Lima, MD    ----------------------------------------- 7:18 AM on 05/16/2023 ----------------------------------------- Pt is now awake, ambulatory to bathroom, steady on feet. No vomiting. Stable for DC - spouse coming to pick up     Sharman Cheek, MD 05/16/23 [phone]

## 2023-05-16 NOTE — ED Provider Notes (Signed)
Patient asleep my entire shift.  Will further be signed out to the day team for further metabolization with anticipation of outpatient management per original plan of care.   Delton Prairie, MD 05/16/23 [phone]

## 2023-05-16 NOTE — ED Notes (Signed)
Patient still resting quietly with eyes closed. VSS, CCM in use, call light within reach. Shoulder immobilizer intact.

## 2023-05-17 ENCOUNTER — Other Ambulatory Visit: Payer: Self-pay

## 2023-05-17 ENCOUNTER — Inpatient Hospital Stay (HOSPITAL_COMMUNITY): Payer: BC Managed Care – PPO

## 2023-05-17 ENCOUNTER — Inpatient Hospital Stay (HOSPITAL_COMMUNITY): Payer: Self-pay

## 2023-05-17 ENCOUNTER — Ambulatory Visit (HOSPITAL_COMMUNITY)
Admission: RE | Admit: 2023-05-17 | Discharge: 2023-05-17 | Disposition: A | Discharge status: Home / Self Care | Payer: BC Managed Care – PPO | Attending: Orthopedic Surgery | Admitting: Orthopedic Surgery

## 2023-05-17 ENCOUNTER — Encounter (HOSPITAL_COMMUNITY): Payer: Self-pay | Admitting: Orthopedic Surgery

## 2023-05-17 ENCOUNTER — Encounter (HOSPITAL_COMMUNITY)
Admission: RE | Disposition: A | Discharge status: Home / Self Care | Payer: Self-pay | Source: Home / Self Care | Attending: Orthopedic Surgery

## 2023-05-17 DIAGNOSIS — I10 Essential (primary) hypertension: Secondary | ICD-10-CM | POA: Diagnosis not present

## 2023-05-17 DIAGNOSIS — Z8249 Family history of ischemic heart disease and other diseases of the circulatory system: Secondary | ICD-10-CM | POA: Insufficient documentation

## 2023-05-17 DIAGNOSIS — S42322A Displaced transverse fracture of shaft of humerus, left arm, initial encounter for closed fracture: Secondary | ICD-10-CM | POA: Diagnosis not present

## 2023-05-17 DIAGNOSIS — F17210 Nicotine dependence, cigarettes, uncomplicated: Secondary | ICD-10-CM | POA: Diagnosis not present

## 2023-05-17 DIAGNOSIS — S4422XA Injury of radial nerve at upper arm level, left arm, initial encounter: Secondary | ICD-10-CM | POA: Insufficient documentation

## 2023-05-17 DIAGNOSIS — S42302A Unspecified fracture of shaft of humerus, left arm, initial encounter for closed fracture: Secondary | ICD-10-CM | POA: Insufficient documentation

## 2023-05-17 DIAGNOSIS — G8918 Other acute postprocedural pain: Secondary | ICD-10-CM | POA: Diagnosis not present

## 2023-05-17 DIAGNOSIS — X58XXXA Exposure to other specified factors, initial encounter: Secondary | ICD-10-CM | POA: Insufficient documentation

## 2023-05-17 DIAGNOSIS — G5632 Lesion of radial nerve, left upper limb: Secondary | ICD-10-CM | POA: Diagnosis not present

## 2023-05-17 DIAGNOSIS — R609 Edema, unspecified: Secondary | ICD-10-CM | POA: Diagnosis not present

## 2023-05-17 DIAGNOSIS — S42352A Displaced comminuted fracture of shaft of humerus, left arm, initial encounter for closed fracture: Secondary | ICD-10-CM | POA: Diagnosis not present

## 2023-05-17 HISTORY — PX: ORIF HUMERUS FRACTURE: SHX2126

## 2023-05-17 LAB — POCT PREGNANCY, URINE: Preg Test, Ur: NEGATIVE

## 2023-05-17 SURGERY — OPEN REDUCTION INTERNAL FIXATION (ORIF) PROXIMAL HUMERUS FRACTURE
Anesthesia: Regional | Laterality: Left

## 2023-05-17 MED ORDER — ROCURONIUM BROMIDE 10 MG/ML (PF) SYRINGE
PREFILLED_SYRINGE | INTRAVENOUS | Status: DC | PRN
  Administered 2023-05-17: 20 mg via INTRAVENOUS
  Administered 2023-05-17: 60 mg via INTRAVENOUS

## 2023-05-17 MED ORDER — LIDOCAINE 2% (20 MG/ML) 5 ML SYRINGE
INTRAMUSCULAR | Status: DC | PRN
  Administered 2023-05-17: 40 mg via INTRAVENOUS

## 2023-05-17 MED ORDER — ACETAMINOPHEN 500 MG PO TABS
1000.0000 mg | ORAL_TABLET | Freq: Once | ORAL | Status: AC
  Administered 2023-05-17: 1000 mg via ORAL
  Filled 2023-05-17: qty 2

## 2023-05-17 MED ORDER — EPHEDRINE SULFATE-NACL 50-0.9 MG/10ML-% IV SOSY
PREFILLED_SYRINGE | INTRAVENOUS | Status: DC | PRN
  Administered 2023-05-17: 5 mg via INTRAVENOUS
  Administered 2023-05-17 (×2): 10 mg via INTRAVENOUS

## 2023-05-17 MED ORDER — LIDOCAINE 2% (20 MG/ML) 5 ML SYRINGE
INTRAMUSCULAR | Status: AC
  Filled 2023-05-17: qty 5

## 2023-05-17 MED ORDER — DEXAMETHASONE SODIUM PHOSPHATE 10 MG/ML IJ SOLN
INTRAMUSCULAR | Status: AC
  Filled 2023-05-17: qty 1

## 2023-05-17 MED ORDER — MIDAZOLAM HCL 2 MG/2ML IJ SOLN: 2.0000 mg | Freq: Once | INTRAMUSCULAR | Status: AC

## 2023-05-17 MED ORDER — MIDAZOLAM HCL 2 MG/2ML IJ SOLN
INTRAMUSCULAR | Status: AC
  Filled 2023-05-17: qty 2

## 2023-05-17 MED ORDER — ONDANSETRON HCL 4 MG/2ML IJ SOLN
INTRAMUSCULAR | Status: AC
  Filled 2023-05-17: qty 2

## 2023-05-17 MED ORDER — MIDAZOLAM HCL 2 MG/2ML IJ SOLN
INTRAMUSCULAR | Status: AC
  Administered 2023-05-17: 2 mg via INTRAVENOUS
  Filled 2023-05-17: qty 2

## 2023-05-17 MED ORDER — ROCURONIUM BROMIDE 10 MG/ML (PF) SYRINGE
PREFILLED_SYRINGE | INTRAVENOUS | Status: AC
  Filled 2023-05-17: qty 10

## 2023-05-17 MED ORDER — KETOROLAC TROMETHAMINE 30 MG/ML IJ SOLN: 30.0000 mg | Freq: Once | INTRAMUSCULAR | Status: AC | PRN

## 2023-05-17 MED ORDER — ROPIVACAINE HCL 5 MG/ML IJ SOLN
INTRAMUSCULAR | Status: DC | PRN
Start: 2023-05-17 — End: 2023-05-17
  Administered 2023-05-17: 15 mL via PERINEURAL

## 2023-05-17 MED ORDER — CHLORHEXIDINE GLUCONATE 0.12 % MT SOLN
OROMUCOSAL | Status: AC
  Administered 2023-05-17: 15 mL via OROMUCOSAL
  Filled 2023-05-17: qty 15

## 2023-05-17 MED ORDER — EPHEDRINE 5 MG/ML INJ
INTRAVENOUS | Status: AC
  Filled 2023-05-17: qty 5

## 2023-05-17 MED ORDER — GLYCOPYRROLATE PF 0.2 MG/ML IJ SOSY
PREFILLED_SYRINGE | INTRAMUSCULAR | Status: AC
  Filled 2023-05-17: qty 1

## 2023-05-17 MED ORDER — PROPOFOL 10 MG/ML IV BOLUS
INTRAVENOUS | Status: AC
  Filled 2023-05-17: qty 20

## 2023-05-17 MED ORDER — FENTANYL CITRATE (PF) 100 MCG/2ML IJ SOLN
INTRAMUSCULAR | Status: AC
  Administered 2023-05-17: 100 ug via INTRAVENOUS
  Filled 2023-05-17: qty 2

## 2023-05-17 MED ORDER — FENTANYL CITRATE (PF) 250 MCG/5ML IJ SOLN
INTRAMUSCULAR | Status: AC
  Filled 2023-05-17: qty 5

## 2023-05-17 MED ORDER — PHENYLEPHRINE 80 MCG/ML (10ML) SYRINGE FOR IV PUSH (FOR BLOOD PRESSURE SUPPORT)
PREFILLED_SYRINGE | INTRAVENOUS | Status: DC | PRN
  Administered 2023-05-17: 80 ug via INTRAVENOUS
  Administered 2023-05-17: 160 ug via INTRAVENOUS

## 2023-05-17 MED ORDER — CEFAZOLIN SODIUM-DEXTROSE 2-3 GM-%(50ML) IV SOLR
INTRAVENOUS | Status: DC | PRN
  Administered 2023-05-17: 2 g via INTRAVENOUS

## 2023-05-17 MED ORDER — PHENYLEPHRINE 80 MCG/ML (10ML) SYRINGE FOR IV PUSH (FOR BLOOD PRESSURE SUPPORT)
PREFILLED_SYRINGE | INTRAVENOUS | Status: AC
  Filled 2023-05-17: qty 10

## 2023-05-17 MED ORDER — MIDAZOLAM HCL 2 MG/2ML IJ SOLN
INTRAMUSCULAR | Status: DC | PRN
  Administered 2023-05-17: 2 mg via INTRAVENOUS

## 2023-05-17 MED ORDER — ALBUMIN HUMAN 5 % IV SOLN
INTRAVENOUS | Status: DC | PRN
Start: 2023-05-17 — End: 2023-05-17

## 2023-05-17 MED ORDER — ORAL CARE MOUTH RINSE: 15.0000 mL | Freq: Once | OROMUCOSAL | Status: AC

## 2023-05-17 MED ORDER — PHENYLEPHRINE HCL-NACL 20-0.9 MG/250ML-% IV SOLN
INTRAVENOUS | Status: DC | PRN
  Administered 2023-05-17: 10 ug/min via INTRAVENOUS

## 2023-05-17 MED ORDER — BUPIVACAINE LIPOSOME 1.3 % IJ SUSP
INTRAMUSCULAR | Status: DC | PRN
Start: 2023-05-17 — End: 2023-05-17
  Administered 2023-05-17: 10 mL via PERINEURAL

## 2023-05-17 MED ORDER — SODIUM CHLORIDE 0.9 % IV SOLN
INTRAVENOUS | Status: DC | PRN
Start: 2023-05-17 — End: 2023-05-17

## 2023-05-17 MED ORDER — FENTANYL CITRATE (PF) 250 MCG/5ML IJ SOLN
INTRAMUSCULAR | Status: DC | PRN
  Administered 2023-05-17: 100 ug via INTRAVENOUS

## 2023-05-17 MED ORDER — CEFAZOLIN SODIUM-DEXTROSE 2-4 GM/100ML-% IV SOLN
INTRAVENOUS | Status: AC
  Filled 2023-05-17: qty 100

## 2023-05-17 MED ORDER — ONDANSETRON HCL 4 MG/2ML IJ SOLN
INTRAMUSCULAR | Status: DC | PRN
  Administered 2023-05-17: 4 mg via INTRAVENOUS

## 2023-05-17 MED ORDER — ONDANSETRON HCL 4 MG/2ML IJ SOLN: 4.0000 mg | Freq: Once | INTRAMUSCULAR | Status: DC | PRN

## 2023-05-17 MED ORDER — AMISULPRIDE (ANTIEMETIC) 5 MG/2ML IV SOLN: 10.0000 mg | Freq: Once | INTRAVENOUS | Status: DC | PRN

## 2023-05-17 MED ORDER — LACTATED RINGERS IV SOLN: INTRAVENOUS | Status: DC

## 2023-05-17 MED ORDER — FENTANYL CITRATE (PF) 100 MCG/2ML IJ SOLN: 100.0000 ug | Freq: Once | INTRAMUSCULAR | Status: AC

## 2023-05-17 MED ORDER — BUPIVACAINE LIPOSOME 1.3 % IJ SUSP
INTRAMUSCULAR | Status: AC
  Filled 2023-05-17: qty 10

## 2023-05-17 MED ORDER — GLYCOPYRROLATE 0.2 MG/ML IJ SOLN
INTRAMUSCULAR | Status: DC | PRN
Start: 2023-05-17 — End: 2023-05-17
  Administered 2023-05-17: .4 mg via INTRAVENOUS

## 2023-05-17 MED ORDER — CHLORHEXIDINE GLUCONATE 0.12 % MT SOLN: 15.0000 mL | Freq: Once | OROMUCOSAL | Status: AC

## 2023-05-17 MED ORDER — METHOCARBAMOL 500 MG PO TABS: 500.0000 mg | ORAL_TABLET | Freq: Three times a day (TID) | ORAL | 0 refills | Status: AC | PRN

## 2023-05-17 MED ORDER — MEPERIDINE HCL 25 MG/ML IJ SOLN: 6.2500 mg | INTRAMUSCULAR | Status: DC | PRN

## 2023-05-17 MED ORDER — 0.9 % SODIUM CHLORIDE (POUR BTL) OPTIME
TOPICAL | Status: DC | PRN
  Administered 2023-05-17: 1000 mL

## 2023-05-17 MED ORDER — HYDROMORPHONE HCL 1 MG/ML IJ SOLN: 0.2500 mg | INTRAMUSCULAR | Status: DC | PRN

## 2023-05-17 MED ORDER — PROPOFOL 10 MG/ML IV BOLUS
INTRAVENOUS | Status: DC | PRN
  Administered 2023-05-17: 150 mg via INTRAVENOUS

## 2023-05-17 MED ORDER — ONDANSETRON 4 MG PO TBDP
4.0000 mg | ORAL_TABLET | Freq: Three times a day (TID) | ORAL | 0 refills | Status: AC | PRN
Start: 2023-05-17 — End: ?

## 2023-05-17 MED ORDER — NEOSTIGMINE METHYLSULFATE 3 MG/3ML IV SOSY
PREFILLED_SYRINGE | INTRAVENOUS | Status: AC
  Filled 2023-05-17: qty 3

## 2023-05-17 MED ORDER — NEOSTIGMINE METHYLSULFATE 10 MG/10ML IV SOLN
INTRAVENOUS | Status: DC | PRN
Start: 2023-05-17 — End: 2023-05-17
  Administered 2023-05-17: 3 mg via INTRAVENOUS

## 2023-05-17 MED ORDER — OXYCODONE HCL 5 MG PO TABS: 5.0000 mg | ORAL_TABLET | Freq: Once | ORAL | Status: DC | PRN

## 2023-05-17 MED ORDER — DEXAMETHASONE SODIUM PHOSPHATE 10 MG/ML IJ SOLN
INTRAMUSCULAR | Status: DC | PRN
  Administered 2023-05-17: 5 mg via INTRAVENOUS

## 2023-05-17 MED ORDER — OXYCODONE HCL 5 MG/5ML PO SOLN: 5.0000 mg | Freq: Once | ORAL | Status: DC | PRN

## 2023-05-17 SURGICAL SUPPLY — 84 items
APL SKNCLS STERI-STRIP NONHPOA (GAUZE/BANDAGES/DRESSINGS) ×2
BAG COUNTER SPONGE SURGICOUNT (BAG) ×1 IMPLANT
BAG SPNG CNTER NS LX DISP (BAG) ×1
BENZOIN TINCTURE PRP APPL 2/3 (GAUZE/BANDAGES/DRESSINGS) ×2 IMPLANT
BIT DRILL 2.5X110 QC LCP DISP (BIT) IMPLANT
BIT DRILL 2.8 (BIT) ×1
BIT DRILL CANN QC 2.8X165 (BIT) IMPLANT
BIT DRILL LONG 2.7 (BIT) IMPLANT
BIT DRILL QC 2X125 (BIT) IMPLANT
BNDG CMPR 5X3 KNIT ELC UNQ LF (GAUZE/BANDAGES/DRESSINGS) ×1
BNDG CMPR 5X4 KNIT ELC UNQ LF (GAUZE/BANDAGES/DRESSINGS) ×1
BNDG ELASTIC 3INX 5YD STR LF (GAUZE/BANDAGES/DRESSINGS) IMPLANT
BNDG ELASTIC 4INX 5YD STR LF (GAUZE/BANDAGES/DRESSINGS) IMPLANT
BNDG GAUZE DERMACEA FLUFF 4 (GAUZE/BANDAGES/DRESSINGS) ×2 IMPLANT
BNDG GZE DERMACEA 4 6PLY (GAUZE/BANDAGES/DRESSINGS) ×1
BRUSH SCRUB EZ PLAIN DRY (MISCELLANEOUS) ×2 IMPLANT
CLSR STERI-STRIP ANTIMIC 1/2X4 (GAUZE/BANDAGES/DRESSINGS) IMPLANT
COVER SURGICAL LIGHT HANDLE (MISCELLANEOUS) ×2 IMPLANT
DRAPE C-ARM 42X72 X-RAY (DRAPES) ×1 IMPLANT
DRAPE C-ARMOR (DRAPES) ×1 IMPLANT
DRAPE IMP U-DRAPE 54X76 (DRAPES) ×1 IMPLANT
DRAPE INCISE IOBAN 66X45 STRL (DRAPES) IMPLANT
DRAPE ORTHO SPLIT 77X108 STRL (DRAPES) ×2
DRAPE SURG 17X11 SM STRL (DRAPES) ×2 IMPLANT
DRAPE SURG ORHT 6 SPLT 77X108 (DRAPES) ×2 IMPLANT
DRAPE U-SHAPE 47X51 STRL (DRAPES) ×2 IMPLANT
DRILL BIT 2.8MM (BIT) ×1
DRILL BIT LONG 2.7 (BIT) ×1
DRSG ADAPTIC 3X8 NADH LF (GAUZE/BANDAGES/DRESSINGS) ×1 IMPLANT
DRSG MEPILEX POST OP 4X12 (GAUZE/BANDAGES/DRESSINGS) IMPLANT
ELECT REM PT RETURN 9FT ADLT (ELECTROSURGICAL) ×1
ELECTRODE REM PT RTRN 9FT ADLT (ELECTROSURGICAL) ×1 IMPLANT
EVACUATOR 1/8 PVC DRAIN (DRAIN) IMPLANT
GAUZE PAD ABD 8X10 STRL (GAUZE/BANDAGES/DRESSINGS) ×1 IMPLANT
GAUZE SPONGE 4X4 12PLY STRL (GAUZE/BANDAGES/DRESSINGS) ×2 IMPLANT
GLOVE BIO SURGEON STRL SZ7.5 (GLOVE) ×1 IMPLANT
GLOVE BIO SURGEON STRL SZ8 (GLOVE) ×1 IMPLANT
GLOVE BIOGEL PI IND STRL 7.5 (GLOVE) ×1 IMPLANT
GLOVE BIOGEL PI IND STRL 8 (GLOVE) ×1 IMPLANT
GLOVE SURG ORTHO LTX SZ7.5 (GLOVE) ×2 IMPLANT
GOWN STRL REUS W/ TWL LRG LVL3 (GOWN DISPOSABLE) ×2 IMPLANT
GOWN STRL REUS W/ TWL XL LVL3 (GOWN DISPOSABLE) ×1 IMPLANT
GOWN STRL REUS W/TWL 2XL LVL3 (GOWN DISPOSABLE) ×1 IMPLANT
GOWN STRL REUS W/TWL LRG LVL3 (GOWN DISPOSABLE) ×2
GOWN STRL REUS W/TWL XL LVL3 (GOWN DISPOSABLE) ×1
KIT BASIN OR (CUSTOM PROCEDURE TRAY) ×1 IMPLANT
KIT TURNOVER KIT B (KITS) ×1 IMPLANT
LOOP VASCLR MAXI BLUE 18IN ST (MISCELLANEOUS) IMPLANT
LOOP VASCULAR MAXI 18 BLUE (MISCELLANEOUS)
LOOPS VASCLR MAXI BLUE 18IN ST (MISCELLANEOUS) IMPLANT
MANIFOLD NEPTUNE II (INSTRUMENTS) ×1 IMPLANT
NS IRRIG 1000ML POUR BTL (IV SOLUTION) ×1 IMPLANT
PACK TOTAL JOINT (CUSTOM PROCEDURE TRAY) ×1 IMPLANT
PACK UNIVERSAL I (CUSTOM PROCEDURE TRAY) ×1 IMPLANT
PAD ARMBOARD 7.5X6 YLW CONV (MISCELLANEOUS) ×2 IMPLANT
PROS LCP PLATE 10 137M (Plate) ×1 IMPLANT
PROSTHESIS LCP PLATE 10 137M (Plate) IMPLANT
SCREW CORTEX 2.7 26MM (Screw) IMPLANT
SCREW LOCK CORT ST 3.5X24 (Screw) IMPLANT
SCREW LOCK CORT ST 3.5X26 (Screw) IMPLANT
SCREW LOCK T15 FT 24X3.5X2.9X (Screw) IMPLANT
SCREW LOCKING 3.5X24 (Screw) ×3 IMPLANT
SCREW LOCKING 3.5X26 (Screw) IMPLANT
SPONGE T-LAP 18X18 ~~LOC~~+RFID (SPONGE) IMPLANT
STAPLER VISISTAT 35W (STAPLE) ×1 IMPLANT
STOCKINETTE IMPERVIOUS LG (DRAPES) ×1 IMPLANT
SUCTION TUBE FRAZIER 10FR DISP (SUCTIONS) ×1 IMPLANT
SUT ETHIBOND 5 LR DA (SUTURE) ×1 IMPLANT
SUT FIBERWIRE #2 38 T-5 BLUE (SUTURE)
SUT MNCRL AB 3-0 PS2 18 (SUTURE) IMPLANT
SUT PDS AB 2-0 CT1 27 (SUTURE) IMPLANT
SUT VIC AB 0 CT1 27 (SUTURE) ×1
SUT VIC AB 0 CT1 27XBRD ANBCTR (SUTURE) ×2 IMPLANT
SUT VIC AB 2-0 CT1 27 (SUTURE) ×1
SUT VIC AB 2-0 CT1 TAPERPNT 27 (SUTURE) ×2 IMPLANT
SUT VIC AB 2-0 CT3 27 (SUTURE) IMPLANT
SUTURE FIBERWR #2 38 T-5 BLUE (SUTURE) IMPLANT
SYR 5ML LL (SYRINGE) IMPLANT
TOWEL GREEN STERILE (TOWEL DISPOSABLE) ×2 IMPLANT
TOWEL GREEN STERILE FF (TOWEL DISPOSABLE) ×1 IMPLANT
TRAY FOLEY MTR SLVR 16FR STAT (SET/KITS/TRAYS/PACK) IMPLANT
VASCULAR TIE MAXI BLUE 18IN ST (MISCELLANEOUS)
WATER STERILE IRR 1000ML POUR (IV SOLUTION) ×1 IMPLANT
YANKAUER SUCT BULB TIP NO VENT (SUCTIONS) IMPLANT

## 2023-05-17 NOTE — Anesthesia Procedure Notes (Addendum)
Procedure Name: Intubation Date/Time: 05/17/2023 9:01 AM  Performed by: Camillia Herter, CRNAPre-anesthesia Checklist: Patient identified, Emergency Drugs available, Suction available and Patient being monitored Patient Re-evaluated:Patient Re-evaluated prior to induction Oxygen Delivery Method: Circle System Utilized Preoxygenation: Pre-oxygenation with 100% oxygen Induction Type: IV induction Ventilation: Mask ventilation without difficulty Laryngoscope Size: Mac and 3 Grade View: Grade I Tube type: Oral Tube size: 7.0 mm Number of attempts: 1 Airway Equipment and Method: Stylet Placement Confirmation: ETT inserted through vocal cords under direct vision, positive ETCO2 and breath sounds checked- equal and bilateral Secured at: 21 cm Tube secured with: Tape Dental Injury: Teeth and Oropharynx as per pre-operative assessment

## 2023-05-17 NOTE — Transfer of Care (Signed)
Immediate Anesthesia Transfer of Care Note  Patient: Marissa Harrison  Procedure(s) Performed: OPEN REDUCTION INTERNAL FIXATION (ORIF) LEFT HUMERUS FRACTURE (Left)  Patient Location: PACU  Anesthesia Type:General and Regional  Level of Consciousness: awake  Airway & Oxygen Therapy: Patient Spontanous Breathing  Post-op Assessment: Report given to RN and Post -op Vital signs reviewed and stable  Post vital signs: Reviewed and stable  Last Vitals:  Vitals Value Taken Time  BP 99/80 05/17/23 1115  Temp 36.9 C 05/17/23 1115  Pulse 57 05/17/23 1127  Resp 10 05/17/23 1127  SpO2 96 % 05/17/23 1127  Vitals shown include unfiled device data.  Last Pain:  Vitals:   05/17/23 1115  TempSrc:   PainSc: Asleep      Patients Stated Pain Goal: 4 (05/17/23 9381)  Complications: No notable events documented.

## 2023-05-17 NOTE — Op Note (Signed)
PATIENT:  Marissa Harrison 47 y.o.  PRE-OPERATIVE DIAGNOSIS:   1. LEFT HUMERAL SHAFT FRACTURE 2. RADIAL NERVE INJURY  POST-OPERATIVE DIAGNOSIS:   1. LEFT HUMERAL SHAFT FRACTURE 2. RADIAL NERVE INJURED BUT LARGELY INTACT  PROCEDURE:  Procedure(s): 1. OPEN REDUCTION INTERNAL FIXATION (ORIF) LEFT HUMERAL SHAFT FRACTURE  2. EXPLORATION AND NEUROPLASTY OF RADIAL NERVE  SURGEON:  Surgeon(s) and Role:    Myrene Galas, MD - Primary  PHYSICIAN ASSISTANT: PA Student  ANESTHESIA:   general  EBL:  100 mL   BLOOD ADMINISTERED:none  DRAINS: none   LOCAL MEDICATIONS USED:  NONE  SPECIMEN:  No Specimen  DISPOSITION OF SPECIMEN:  N/A  COUNTS:  YES  TOURNIQUET:  * No tourniquets in log *  DICTATION: Note written in EPIC  PLAN OF CARE: Admit to inpatient   PATIENT DISPOSITION:  PACU - hemodynamically stable.   Delay start of Pharmacological VTE agent (>24hrs) due to surgical blood loss or risk of bleeding: no  BRIEF SUMMARY AND INDICATIONS FOR PROCEDURE:  Marissa Harrison is a 47 y.o. who sustained fall resulting in displaced transverse humeral shaft fracture and complete radial nerve deficit. I discussed with the patient the risks and benefits of surgery, including the possibility of infection, nerve injury, vessel injury, symptomatic hardware, DVT/ PE, loss of motion, malunion, nonunion, and need for further surgery among others.  These risks were acknowledged and consent provided to proceed.  BRIEF SUMMARY OF PROCEDURE:  After administration of preoperative antibiotics, the patient was taken to the OR where general anesthesia was induced.  The left upper extremity was washed with chlorhexidine soap and then a standard Betadine scrub and paint was performed.  This was followed by application of sterile drapes in standard fashion.  A timeout was held.  A standard anterior approach was made through a long incision over the anterolateral aspect of the arm.  Dissection was carried down  through the soft tissues carefully to protect the cephalic vein, which was retracted laterally.  The biceps fascia was incised and the biceps retracted medially.  This exposed the brachialis which was split midline.  We encountered hematoma and a short oblique fracture site.   With the help of my assistant, careful retraction allowed for removal of hematoma with use of curettes and lavage.  I was careful to protect periosteal attachments at all times. Because I was able to identify the nerve proximally I proceeded with formal exploration, retracting the bone medially and soft tissues laterally. With the Debakey's and Metzenbaums I followed the nerve down to the fracture site where laceration of the perineurium and adjacent soft tissues was identified as well as some tethering of the nerve. Nearly all the fascicles appeared to be intact as was the vasovasorum laterally. I released some of the tethering fascia which was producing a kinking effect on the nerve. After release the nerve was subsequently mobile, straight, and not under tension. Attention was then turned back to the fracture.  With the help of my assistant, tenaculums were placed  under direct visualization, obtaining anatomic reduction. I overdrilled the near cortex and used a countersink, then secured fixation in the far cortex to achieve compression with a 2.7 mm lag screw.  I then brought in a C-arm and on AP and lateral views confirmed appropriate reduction and screw placement.  This was followed by application of a long 3.5 Synthes LC-DCP plate.  I secured 8 cortices of fixation  both proximal and distally.  I did use 2  lock screws proximally and 2 distally to augment the fixation.   A PA student assisted me throughout and an assistant was required to protect the neurovascular bundle, to achieve reduction and then during  provisional definitive fixation.  The brachialis was repaired with 0 Vicryl and then 0 Vicryl for the deep subcu, 2-0  Vicryl and running 3-0 monocryl with steristrips for the subcuticular layer and skin.  A sterile gentle compressive dressing was applied and then an Ace wrap from hand to upper arm.    There were no complications during the procedure.  PROGNOSIS:  The patient will have unrestricted passive and active range of motion of the elbow, wrist, and digits. Passive and gentle active assisted motion of the shoulder is encouraged, but no active abduction against resistance (including gravity). May shower in two days and leave wound open to air. Remove sutures at 10-14 days. OT will assist with radial nerve program but recovery is expected.

## 2023-05-17 NOTE — Anesthesia Postprocedure Evaluation (Signed)
Anesthesia Post Note  Patient: Marissa Harrison  Procedure(s) Performed: OPEN REDUCTION INTERNAL FIXATION (ORIF) LEFT HUMERUS FRACTURE (Left)     Patient location during evaluation: PACU Anesthesia Type: Regional and General Level of consciousness: awake and alert, oriented and patient cooperative Pain management: pain level controlled Vital Signs Assessment: post-procedure vital signs reviewed and stable Respiratory status: spontaneous breathing, nonlabored ventilation and respiratory function stable Cardiovascular status: blood pressure returned to baseline and stable Postop Assessment: no apparent nausea or vomiting Anesthetic complications: no   No notable events documented.  Last Vitals:  Vitals:   05/17/23 1145 05/17/23 1200  BP: 131/64 123/87  Pulse: 75 (!) 59  Resp: 18 12  Temp:    SpO2: 100% 98%    Last Pain:  Vitals:   05/17/23 1200  TempSrc:   PainSc: 0-No pain                 Lannie Fields

## 2023-05-17 NOTE — Anesthesia Procedure Notes (Signed)
Anesthesia Regional Block: Supraclavicular block   Pre-Anesthetic Checklist: , timeout performed,  Correct Patient, Correct Site, Correct Laterality,  Correct Procedure, Correct Position, site marked,  Risks and benefits discussed,  Surgical consent,  Pre-op evaluation,  At surgeon's request and post-op pain management  Laterality: Left  Prep: Maximum Sterile Barrier Precautions used, chloraprep       Needles:  Injection technique: Single-shot  Needle Type: Echogenic Stimulator Needle     Needle Length: 9cm  Needle Gauge: 22     Additional Needles:   Procedures:,,,, ultrasound used (permanent image in chart),,    Narrative:  Start time: 05/17/2023 8:20 AM End time: 05/17/2023 8:25 AM Injection made incrementally with aspirations every 5 mL.  Performed by: Personally  Anesthesiologist: Lannie Fields, DO  Additional Notes: Monitors applied. No increased pain on injection. No increased resistance to injection. Injection made in 5cc increments. Good needle visualization. Patient tolerated procedure well.

## 2023-05-17 NOTE — Anesthesia Preprocedure Evaluation (Addendum)
Anesthesia Evaluation  Patient identified by MRN, date of birth, ID band Patient awake    Reviewed: Allergy & Precautions, NPO status , Patient's Chart, lab work & pertinent test results  Airway Mallampati: III  TM Distance: >3 FB Neck ROM: Full    Dental  (+) Teeth Intact, Dental Advisory Given   Pulmonary Current Smoker Snores at night, no sleep study 1ppd  No inhalers    Pulmonary exam normal breath sounds clear to auscultation       Cardiovascular hypertension (145/78 preop), Normal cardiovascular exam Rhythm:Regular Rate:Normal     Neuro/Psych negative neurological ROS  negative psych ROS   GI/Hepatic negative GI ROS,,,(+)     substance abuse  alcohol useActively intoxicated on arrival yesterday    Endo/Other  negative endocrine ROS    Renal/GU negative Renal ROS  negative genitourinary   Musculoskeletal L humeral shaft fx   Abdominal   Peds  Hematology negative hematology ROS (+) Hb 12.4, plt 239   Anesthesia Other Findings   Reproductive/Obstetrics negative OB ROS S/p TL                              Anesthesia Physical Anesthesia Plan  ASA: 3  Anesthesia Plan: General and Regional   Post-op Pain Management: Regional block* and Tylenol PO (pre-op)*   Induction: Intravenous  PONV Risk Score and Plan: 2 and Ondansetron, Dexamethasone, Midazolam and Treatment may vary due to age or medical condition  Airway Management Planned: Oral ETT  Additional Equipment: None  Intra-op Plan:   Post-operative Plan: Extubation in OR  Informed Consent: I have reviewed the patients History and Physical, chart, labs and discussed the procedure including the risks, benefits and alternatives for the proposed anesthesia with the patient or authorized representative who has indicated his/her understanding and acceptance.     Dental advisory given  Plan Discussed with:  CRNA  Anesthesia Plan Comments:        Anesthesia Quick Evaluation

## 2023-05-17 NOTE — Discharge Instructions (Addendum)
[facility] Discharge Instructions   General Discharge Instructions  Orthopaedic Injuries:  Left humerus fracture treated with open reduction and internal fixation using plate and screws   WEIGHT BEARING STATUS: no lifting, pushing or pulling with Left arm   RANGE OF MOTION/ACTIVITY: come out of sling several times a day to move elbow, forearm, wrist and hand.  Gentle shoulder pendulum exercises only  Bone health:   Review the following resource for additional information regarding bone health  [email or web address][employer]cy  Wound Care: daily wound care starting on 05/19/2023. See below  Discharge Wound Care Instructions  Do NOT apply any ointments, solutions or lotions to pin sites or surgical wounds.  These prevent needed drainage and even though solutions like hydrogen peroxide kill bacteria, they also damage cells lining the pin sites that help fight infection.  Applying lotions or ointments can keep the wounds moist and can cause them to breakdown and open up as well. This can increase the risk for infection. When in doubt call the office.  Surgical incisions should be dressed daily.  If any drainage is noted, use one layer of adaptic or Mepitel, then gauze and tape. Alternatively you can use a silicone foam dressing such as a mepilex   Net[employer] https://dennis-soto.com/?pd_rd_i=[email or web address]&th=1  [email or web address].com/  These dressing supplies should be available at local medical supply stores ([employer], [facility] [employer], etc). They are not usually carried at places like [facility], [facility], [facility], etc  Once the incision is completely dry and without drainage, it may be left open to air out.  Showering may begin 36-48 hours later.  Cleaning gently with  soap and water.  Diet: as you were eating previously.  Can use over the counter stool softeners and bowel preparations, such as Miralax, to help with bowel movements.  Narcotics can be constipating.  Be sure to drink plenty of fluids  PAIN MEDICATION USE AND EXPECTATIONS  You have likely been given narcotic medications to help control your pain.  After a traumatic event that results in an fracture (broken bone) with or without surgery, it is ok to use narcotic pain medications to help control one's pain.  We understand that everyone responds to pain differently and each individual patient will be evaluated on a regular basis for the continued need for narcotic medications. Ideally, narcotic medication use should last no more than 6-8 weeks (coinciding with fracture healing).   As a patient it is your responsibility as well to monitor narcotic medication use and report the amount and frequency you use these medications when you come to your office visit.   We would also advise that if you are using narcotic medications, you should take a dose prior to therapy to maximize you participation.  IF YOU ARE ON NARCOTIC MEDICATIONS IT IS NOT PERMISSIBLE TO OPERATE A MOTOR VEHICLE (MOTORCYCLE/CAR/TRUCK/MOPED) OR HEAVY MACHINERY DO NOT MIX NARCOTICS WITH OTHER CNS (CENTRAL NERVOUS SYSTEM) DEPRESSANTS SUCH AS ALCOHOL   POST-OPERATIVE OPIOID TAPER INSTRUCTIONS: It is important to wean off of your opioid medication as soon as possible. If you do not need pain medication after your surgery it is ok to stop day one. Opioids include: Codeine, Hydrocodone(Norco, Vicodin), Oxycodone(Percocet, oxycontin) and hydromorphone amongst others.  Long term and even short term use of opiods can cause: Increased pain response Dependence Constipation Depression Respiratory depression And more.  Withdrawal symptoms can include Flu like symptoms Nausea, vomiting And more Techniques to manage these symptoms Hydrate  well Eat regular healthy meals Stay active Use relaxation techniques(deep  breathing, meditating, yoga) Do Not substitute Alcohol to help with tapering If you have been on opioids for less than two weeks and do not have pain than it is ok to stop all together.  Plan to wean off of opioids This plan should start within one week post op of your fracture surgery  Maintain the same interval or time between taking each dose and first decrease the dose.  Cut the total daily intake of opioids by one tablet each day Next start to increase the time between doses. The last dose that should be eliminated is the evening dose.    STOP SMOKING OR USING NICOTINE PRODUCTS!!!!  As discussed nicotine severely impairs your body's ability to heal surgical and traumatic wounds but also impairs bone healing.  Wounds and bone heal by forming microscopic blood vessels (angiogenesis) and nicotine is a vasoconstrictor (essentially, shrinks blood vessels).  Therefore, if vasoconstriction occurs to these microscopic blood vessels they essentially disappear and are unable to deliver necessary nutrients to the healing tissue.  This is one modifiable factor that you can do to dramatically increase your chances of healing your injury.    (This means no smoking, no nicotine gum, patches, etc)  DO NOT USE NONSTEROIDAL ANTI-INFLAMMATORY DRUGS (NSAID'S)  Using products such as Advil (ibuprofen), Aleve (naproxen), Motrin (ibuprofen) for additional pain control during fracture healing can delay and/or prevent the healing response.  If you would like to take over the counter (OTC) medication, Tylenol (acetaminophen) is ok.  However, some narcotic medications that are given for pain control contain acetaminophen as well. Therefore, you should not exceed more than 4000 mg of tylenol in a day if you do not have liver disease.  Also note that there are may OTC medicines, such as cold medicines and allergy medicines that my contain tylenol  as well.  If you have any questions about medications and/or interactions please ask your doctor/PA or your pharmacist.      ICE AND ELEVATE INJURED/OPERATIVE EXTREMITY  Using ice and elevating the injured extremity above your heart can help with swelling and pain control.  Icing in a pulsatile fashion, such as 20 minutes on and 20 minutes off, can be followed.    Do not place ice directly on skin. Make sure there is a barrier between to skin and the ice pack.    Using frozen items such as frozen peas works well as the conform nicely to the are that needs to be iced.  USE AN ACE WRAP OR TED HOSE FOR SWELLING CONTROL  In addition to icing and elevation, Ace wraps or TED hose are used to help limit and resolve swelling.  It is recommended to use Ace wraps or TED hose until you are informed to stop.    When using Ace Wraps start the wrapping distally (farthest away from the body) and wrap proximally (closer to the body)   Example: If you had surgery on your leg or thing and you do not have a splint on, start the ace wrap at the toes and work your way up to the thigh        If you had surgery on your upper extremity and do not have a splint on, start the ace wrap at your fingers and work your way up to the upper arm  IF YOU ARE IN A SPLINT OR CAST DO NOT REMOVE IT FOR ANY REASON   If your splint gets wet for any reason please contact the office immediately.  You may shower in your splint or cast as long as you keep it dry.  This can be done by wrapping in a cast cover or garbage back (or similar)  Do Not stick any thing down your splint or cast such as pencils, money, or hangers to try and scratch yourself with.  If you feel itchy take benadryl as prescribed on the bottle for itching  IF YOU ARE IN A CAM BOOT (BLACK BOOT)  You may remove boot periodically. Perform daily dressing changes as noted below.  Wash the liner of the boot regularly and wear a sock when wearing the boot. It is recommended that  you sleep in the boot until told otherwise    Call office for the following: Temperature greater than 101F Persistent nausea and vomiting Severe uncontrolled pain Redness, tenderness, or signs of infection (pain, swelling, redness, odor or green/yellow discharge around the site) Difficulty breathing, headache or visual disturbances Hives Persistent dizziness or light-headedness Extreme fatigue Any other questions or concerns you may have after discharge  In an emergency, call 911 or go to an Emergency Department at a nearby hospital  HELPFUL INFORMATION  If you had a block, it will wear off between 8-24 hrs postop typically.  This is period when your pain may go from nearly zero to the pain you would have had postop without the block.  This is an abrupt transition but nothing dangerous is happening.  You may take an extra dose of narcotic when this happens.  You should wean off your narcotic medicines as soon as you are able.  Most patients will be off or using minimal narcotics before their first postop appointment.   We suggest you use the pain medication the first night prior to going to bed, in order to ease any pain when the anesthesia wears off. You should avoid taking pain medications on an empty stomach as it will make you nauseous.  Do not drink alcoholic beverages or take illicit drugs when taking pain medications.  In most states it is against the law to drive while you are in a splint or sling.  And certainly against the law to drive while taking narcotics.  You may return to work/school in the next couple of days when you feel up to it.   Pain medication may make you constipated.  Below are a few solutions to try in this order: Decrease the amount of pain medication if you aren't having pain. Drink lots of decaffeinated fluids. Drink prune juice and/or each dried prunes  If the first 3 don't work start with additional solutions Take Colace - an over-the-counter stool  softener Take Senokot - an over-the-counter laxative Take Miralax - a stronger over-the-counter laxative     CALL THE OFFICE WITH ANY QUESTIONS OR CONCERNS: [phone]   VISIT OUR WEBSITE FOR ADDITIONAL INFORMATION: [email or web address]

## 2023-05-17 NOTE — H&P (Addendum)
[facility]opaedic Trauma Service H&P  Patient ID: Marissa Harrison MRN: [id number] DOB/AGE: 1976-05-08 47 y.o.  Chief Complaint: left humerus fracture HPI: Marissa Harrison is an 47 y.o. female.who sustained left humerus fracture with associated radial nerve deficit.   Past Medical History:  Diagnosis Date   HSV infection     Past Surgical History:  Procedure Laterality Date   BREAST BIOPSY Right 04/13/2017   BENIGN NODULAR ADENOSIS, wing shaped marker   BREAST BIOPSY Left 11/14/2022   [place] Core Bx Ribbon clip path pending   BREAST BIOPSY Left 11/14/2022   [place] LT BREAST BX W LOC DEV 1ST LESION IMG BX SPEC US GUIDE 11/14/2022 [facility]   BREAST CYST ASPIRATION Right 2007   neg   CESAREAN SECTION  1992   TUBAL LIGATION  2003    Family History  Problem Relation Age of Onset   Cancer Maternal Grandfather        kidney   Hypertension Father    Cirrhosis Father    Breast cancer Maternal Grandmother 45   Breast cancer Paternal Aunt    Ovarian cancer Neg Hx    Social History:  reports that she has been smoking cigarettes. She has a 10 pack-year smoking history. She has never used smokeless tobacco. She reports current alcohol use. She reports that she does not use drugs.  Allergies:  Allergies  Allergen Reactions   Codeine Nausea Only and Other (See Comments)    Medications Prior to Admission  Medication Sig Dispense Refill   hydroxypropyl methylcellulose / hypromellose (ISOPTO TEARS / GONIOVISC) 2.5 % ophthalmic solution Place 1 drop into both eyes daily.     levonorgestrel (MIRENA) 20 MCG/24HR IUD 1 each by Intrauterine route once.     ondansetron (ZOFRAN-ODT) 4 MG disintegrating tablet Take 1 tablet (4 mg total) by mouth every 8 (eight) hours as needed for nausea or vomiting. 20 tablet 0   oxyCODONE (ROXICODONE) 5 MG immediate release tablet Take 1 tablet (5 mg total) by mouth every 4 (four) hours as needed for severe pain (pain score 7-10). 20 tablet 0    Prenatal Vit-Fe Fumarate-FA (PRENATAL MULTIVITAMIN) TABS tablet Take 1 tablet by mouth daily at 12 noon.     valACYclovir (VALTREX) 500 MG tablet TAKE 1 TABLET(500 MG) BY MOUTH TWICE DAILY AS NEEDED 30 tablet 0    Results for orders placed or performed during the hospital encounter of 05/17/23 (from the past 48 hour(s))  Pregnancy, urine POC     Status: None   Collection Time: 05/17/23  7:02 AM  Result Value Ref Range   Preg Test, Ur NEGATIVE NEGATIVE    Comment:        THE SENSITIVITY OF THIS METHODOLOGY IS >24 mIU/mL    DG Humerus Left  Result Date: 05/15/2023 CLINICAL DATA:  Status post reduction. EXAM: LEFT HUMERUS - 2+ VIEW COMPARISON:  None Available. FINDINGS: The left humerus was imaged in a fiberglass cast with subsequently obscured osseous and soft tissue detail. An acute fracture deformity is seen involving the mid left humeral shaft. A proximally 1 shaft width anterior and lateral displacement of the distal fracture site is seen. There is no evidence of dislocation. Soft tissues are unremarkable. IMPRESSION: Acute fracture of the mid left humeral shaft. Electronically Signed   By: Aram Candela M.D.   On: 05/15/2023 23:14   DG Shoulder Left  Result Date: 05/15/2023 CLINICAL DATA:  Status post trauma. EXAM: LEFT SHOULDER - 2+ VIEW COMPARISON:  None  Available. FINDINGS: An acute, markedly displaced fracture deformity is seen involving the distal left humeral shaft. Lateral displacement and marked severity lateral angulation of the distal fracture site is noted. There is no evidence of dislocation. There is no evidence of arthropathy or other focal bone abnormality. Soft tissues are unremarkable. IMPRESSION: Acute fracture of the distal left humeral shaft. Electronically Signed   By: Aram Candela M.D.   On: 05/15/2023 23:13   DG Humerus Left  Result Date: 05/15/2023 CLINICAL DATA:  Status post trauma. EXAM: LEFT HUMERUS - 2+ VIEW COMPARISON:  None Available. FINDINGS: An  acute, markedly displaced fracture is seen involving the mid to distal left humeral shaft. Marked severity lateral angulation of the distal fracture site is noted. There is no evidence of dislocation. Diffuse soft tissue swelling is seen. IMPRESSION: Acute, markedly displaced fracture of the mid to distal left humeral shaft. Electronically Signed   By: Aram Candela M.D.   On: 05/15/2023 23:12   CT Head Wo Contrast  Result Date: 05/15/2023 CLINICAL DATA:  Fall while going up steps, distracting left humerus injury EXAM: CT HEAD WITHOUT CONTRAST CT CERVICAL SPINE WITHOUT CONTRAST TECHNIQUE: Multidetector CT imaging of the head and cervical spine was performed following the standard protocol without intravenous contrast. Multiplanar CT image reconstructions of the cervical spine were also generated. RADIATION DOSE REDUCTION: This exam was performed according to the departmental dose-optimization program which includes automated exposure control, adjustment of the mA and/or kV according to patient size and/or use of iterative reconstruction technique. COMPARISON:  None Available. FINDINGS: CT HEAD FINDINGS Brain: No evidence of acute infarct, hemorrhage, mass, mass effect, or midline shift. No hydrocephalus or extra-axial fluid collection. Vascular: No hyperdense vessel. Skull: Negative for fracture or focal lesion. Sinuses/Orbits: Mild mucosal thickening in the ethmoid air cells. No acute finding in the orbits. Other: The mastoid air cells are well aerated. CT CERVICAL SPINE FINDINGS Alignment: No traumatic listhesis. Reversal of the normal cervical lordosis, some of which may be positional. Skull base and vertebrae: No acute fracture or suspicious osseous lesion. Soft tissues and spinal canal: No prevertebral fluid or swelling. No visible canal hematoma. Disc levels: Degenerative changes in the cervical spine.No high-grade spinal canal stenosis. Upper chest: Negative. IMPRESSION: 1. No acute intracranial  process. 2. No acute fracture or traumatic listhesis in the cervical spine. Electronically Signed   By: Wiliam Ke M.D.   On: 05/15/2023 22:40   CT Cervical Spine Wo Contrast  Result Date: 05/15/2023 CLINICAL DATA:  Fall while going up steps, distracting left humerus injury EXAM: CT HEAD WITHOUT CONTRAST CT CERVICAL SPINE WITHOUT CONTRAST TECHNIQUE: Multidetector CT imaging of the head and cervical spine was performed following the standard protocol without intravenous contrast. Multiplanar CT image reconstructions of the cervical spine were also generated. RADIATION DOSE REDUCTION: This exam was performed according to the departmental dose-optimization program which includes automated exposure control, adjustment of the mA and/or kV according to patient size and/or use of iterative reconstruction technique. COMPARISON:  None Available. FINDINGS: CT HEAD FINDINGS Brain: No evidence of acute infarct, hemorrhage, mass, mass effect, or midline shift. No hydrocephalus or extra-axial fluid collection. Vascular: No hyperdense vessel. Skull: Negative for fracture or focal lesion. Sinuses/Orbits: Mild mucosal thickening in the ethmoid air cells. No acute finding in the orbits. Other: The mastoid air cells are well aerated. CT CERVICAL SPINE FINDINGS Alignment: No traumatic listhesis. Reversal of the normal cervical lordosis, some of which may be positional. Skull base and vertebrae: No acute fracture  or suspicious osseous lesion. Soft tissues and spinal canal: No prevertebral fluid or swelling. No visible canal hematoma. Disc levels: Degenerative changes in the cervical spine.No high-grade spinal canal stenosis. Upper chest: Negative. IMPRESSION: 1. No acute intracranial process. 2. No acute fracture or traumatic listhesis in the cervical spine. Electronically Signed   By: Wiliam Ke M.D.   On: 05/15/2023 22:40    ROS No recent fever, bleeding abnormalities, urologic dysfunction, GI problems, or weight  gain.  Blood pressure (!) 145/78, pulse 78, temperature 98 F (36.7 C), temperature source Oral, resp. rate 18, height 5\' 8"  (1.727 m), weight 63.5 kg, SpO2 100%. Physical Exam NCAT RRR CTA Abd soft, NT, ND LUEx  shoulder sling and splint in place elbow, wrist, digits- no skin wounds, nontender, no instability, no blocks to motion  Sens  Ax/M/U intact; absent radial sens  Mot   Ax/ M/ AIN/ U intact; R absent  Brisk CR  Assessment/Plan  Left humerus fracture Radial nerve injury Nicotine  The risks and benefits of surgery were discussed with the patient, including the possibility of infection, nerve injury, vessel injury, wound breakdown, arthritis, symptomatic hardware, DVT/ PE, loss of motion, malunion, nonunion, and need for further surgery among others. These risks were acknowledged and consent provided to proceed.  Myrene Galas, MD [facility], [facility] [phone]  05/17/2023, 8:10 AM  [facility] [address] [place] [address] [phone] (F)

## 2023-05-18 NOTE — Plan of Care (Signed)
[facility] Tonsillectomy/Adenoidectomy, Postoperative PEDS care plan entered in error.

## 2023-05-22 ENCOUNTER — Encounter (HOSPITAL_COMMUNITY): Payer: Self-pay | Admitting: Orthopedic Surgery

## 2023-05-23 DIAGNOSIS — S42322D Displaced transverse fracture of shaft of humerus, left arm, subsequent encounter for fracture with routine healing: Secondary | ICD-10-CM | POA: Diagnosis not present

## 2023-05-23 DIAGNOSIS — M25532 Pain in left wrist: Secondary | ICD-10-CM | POA: Diagnosis not present

## 2023-05-29 ENCOUNTER — Ambulatory Visit: Payer: BC Managed Care – PPO | Attending: Orthopedic Surgery | Admitting: Occupational Therapy

## 2023-05-29 ENCOUNTER — Encounter: Payer: Self-pay | Admitting: Occupational Therapy

## 2023-05-29 DIAGNOSIS — M6281 Muscle weakness (generalized): Secondary | ICD-10-CM | POA: Diagnosis not present

## 2023-05-29 DIAGNOSIS — M25622 Stiffness of left elbow, not elsewhere classified: Secondary | ICD-10-CM | POA: Diagnosis not present

## 2023-05-29 DIAGNOSIS — M25632 Stiffness of left wrist, not elsewhere classified: Secondary | ICD-10-CM | POA: Diagnosis not present

## 2023-05-29 DIAGNOSIS — M25612 Stiffness of left shoulder, not elsewhere classified: Secondary | ICD-10-CM | POA: Insufficient documentation

## 2023-05-29 DIAGNOSIS — L905 Scar conditions and fibrosis of skin: Secondary | ICD-10-CM | POA: Diagnosis not present

## 2023-05-29 DIAGNOSIS — G5632 Lesion of radial nerve, left upper limb: Secondary | ICD-10-CM | POA: Diagnosis not present

## 2023-05-29 NOTE — Therapy (Signed)
OUTPATIENT OCCUPATIONAL THERAPY ORTHO EVALUATION  Patient Name: Marissa Harrison MRN: [id number] DOB:[birth year], 47 y.o., female Today's Date: 05/29/2023  PCP: Darrick Penna NP REFERRING PROVIDER: Dr Carola Frost  END OF SESSION:  OT End of Session - 05/29/23 1853     Visit Number 1    Number of Visits 24    Date for OT Re-Evaluation 08/21/23    OT Start Time 1402    OT Stop Time 1512    OT Time Calculation (min) 70 min    Activity Tolerance Patient tolerated treatment well    Behavior During Therapy [facility] for tasks assessed/performed             Past Medical History:  Diagnosis Date   HSV infection    Past Surgical History:  Procedure Laterality Date   BREAST BIOPSY Right 04/13/2017   BENIGN NODULAR ADENOSIS, wing shaped marker   BREAST BIOPSY Left 11/14/2022   [place] Core Bx Ribbon clip path pending   BREAST BIOPSY Left 11/14/2022   [place] LT BREAST BX W LOC DEV 1ST LESION IMG BX SPEC US GUIDE 11/14/2022 [facility]   BREAST CYST ASPIRATION Right 2007   neg   CESAREAN SECTION  1992   ORIF HUMERUS FRACTURE Left 05/17/2023   Procedure: OPEN REDUCTION INTERNAL FIXATION (ORIF) LEFT HUMERUS FRACTURE;  Surgeon: Myrene Galas, MD;  Location: [facility] OR;  Service: Orthopedics;  Laterality: Left;   TUBAL LIGATION  2003   There are no problems to display for this patient.   ONSET DATE: 05/17/23  REFERRING DIAG: L humerus shaft fx with ORIF and Radial N injury  THERAPY DIAG:  Radial nerve palsy, left  Stiffness of left elbow, not elsewhere classified  Muscle weakness (generalized)  Stiffness of left wrist, not elsewhere classified  Stiffness of left shoulder, not elsewhere classified  Scar condition and fibrosis of skin  Rationale for Evaluation and Treatment: Rehabilitation  SUBJECTIVE:   SUBJECTIVE STATEMENT: I fell going into the house on the stairs.  My husband thought I passed out.  I have been doing some shoulder exercises and not really wearing that brace.  The swelling is  much better provide took that compression off. Pt accompanied by: self  PERTINENT HISTORY:  OP NOTE DR HANDY on 05/17/23: PRE-OPERATIVE DIAGNOSIS:   1. LEFT HUMERAL SHAFT FRACTURE 2. RADIAL NERVE INJURY   POST-OPERATIVE DIAGNOSIS:   1. LEFT HUMERAL SHAFT FRACTURE 2. RADIAL NERVE INJURED BUT LARGELY INTACT   PROCEDURE:  Procedure(s): 1. OPEN REDUCTION INTERNAL FIXATION (ORIF) LEFT HUMERAL SHAFT FRACTURE  2. EXPLORATION AND NEUROPLASTY OF RADIAL NERVE      BRIEF SUMMARY AND INDICATIONS FOR PROCEDURE:  Marissa Harrison is a 47 y.o. who sustained fall resulting in displaced transverse humeral shaft fracture and complete radial nerve deficit.    PRECAUTIONS: No AROM shoulder ABD - not even gravity; AAROM and PROM for wrist , elbow and shoulder flexion; NWB ; Radial N injury     WEIGHT BEARING RESTRICTIONS: No  PAIN:  Are you having pain? Nerve pain posterior upper arm , elbow down to dorsal forearm and hand - 3/10 FALLS: Has patient fallen in last 6 months? Yes. 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  And alsothis nerve injury.  NEXT MD VISIT: 13th Nov  OBJECTIVE:  Note: Objective measures were completed at Evaluation unless otherwise noted.  HAND DOMINANCE: Right    UPPER EXTREMITY ROM:       Passive ROM Right eval Left eval  Shoulder flexion  140 supine  Shoulder abduction  PROM 90 supine  Shoulder adduction    Shoulder extension    Shoulder internal rotation    Shoulder external rotation    Elbow flexion  130 AAROM  Elbow extension  -50 coming in - in session -38 AAROM supine after heat  Wrist flexion  90  Wrist extension  On table- without gravity - to neutral with AAROM   Wrist ulnar deviation  On table - 30  Wrist radial deviation  On table - 20  Wrist  pronation  90  Wrist supination  90  (Blank rows = not tested)  Active ROM Right eval Left eval  Thumb MCP (0-60)    Thumb IP (0-80)    Thumb Radial abd/add (0-55)  Decrease at [facility]    Thumb Palmar abd/add (0-45)  Unable to maintain - drop into flexion    Thumb Opposition to Small Finger  Unable    Index MCP (0-90)     Index PIP (0-100)     Index DIP (0-70)      Long MCP (0-90)      Long PIP (0-100)      Long DIP (0-70)      Ring MCP (0-90)      Ring PIP (0-100)      Ring DIP (0-70)      Little MCP (0-90)      Little PIP (0-100)      Little DIP (0-70)      Pt can make fist - unable to extend digits- if [facility] support at [facility] - can extend PIP's    UPPER EXTREMITY MMT:     MMT Right eval Left eval  Shoulder flexion    Shoulder abduction    Shoulder adduction    Shoulder extension    Shoulder internal rotation    Shoulder external rotation    Middle trapezius    Lower trapezius    Elbow flexion    Elbow extension    Wrist flexion    Wrist extension    Wrist ulnar deviation    Wrist radial deviation    Wrist pronation    Wrist supination    (Blank rows = not tested)  HAND FUNCTION: NT - Surgery 05/17/23  COORDINATION: Radial N injury - impaired   SENSATION: Numbness and burning pain reported posterior upper arm, elbow over dorsal forearm into hand   EDEMA: per pt better than it was last week - done contrast following OT instructions last week and wore tubigip - removed it   COGNITION: Overall cognitive status: Within functional limits for tasks assessed      TODAY'S TREATMENT:  DATE: 05/29/23 Patient to continue with contrast if increased edema in upper arm, elbow through hand. Patient to do 2-3 times a day per Dr. Magdalene Patricia order active assisted range passive range of motion in supine for shoulder flexion and abduction only passive  range of motion to about 9010 reps Patient to keep it under 2/10 pain In supine after some moist heat to elbow active assisted range of motion for elbow flexion extension in 3 positions 5 reps Slight pull keeping it under 2/10 Active range of motion for supination pronation 10 reps On table active assisted range of motion for ulnar radial deviation 10 reps Wrist extension on table in neutral do active assisted range of motion passive range of motion for wrist extension with attempts to do active motion on table sliding 10 reps Active assisted range of motion for thumb palmar radial abduction 10 reps on table  Patient info provided for a prefab radial nerve splint to facilitate gripping and opening hand for functional use. Patient also recommended to wear wrist splint when sleeping as well as during the day to prevent flexion contracture as well as overstretching of wrist extensors.    PATIENT EDUCATION: Education details: Findings of evaluation and home program as well as splint wearing Person educated: Patient Education method: Explanation, Demonstration, Tactile cues, Verbal cues, and Handouts Education comprehension: verbalized understanding, returned demonstration, verbal cues required, tactile cues required, and needs further education   GOALS: Goals reviewed with patient? Yes  SHORT TERM GOALS: Target date: 4 wks   Patient to be independent and wearing splints correctly to prevent flexion contractures as well as facilitating functional use of left hand Baseline: Patient arrived with wrist splint on but report not really wearing it.  Patient with some discomfort at wrist as well as tightness with wrist extension.  Patient to wear wrist splint when doing elbow and shoulder exercises.  Info provided to order prefab radial nerve splint. Goal status: INITIAL  2.  Patient to be independent in home program to initiate passive range of motion and active assisted range of motion to left  shoulder to prevent stiffness until next appointment with surgeon Baseline: Patient reports she has been doing active range of motion with shoulder overhead ;not wearing a sling or compression and not really wearing any more wrist splint or  sleeping with her splint Goal status: INITIAL NITIAL  LONG TERM GOALS: Target date: 12 wks  Patient to be independent in home program and progression as radial nerve improves to facilitate wrist extension and thumb and digit extension. Baseline: Stiffness in wrist extension.  No wrist extension against gravity.  Partial active assisted range of motion to neutral without gravity on table no digit extension and limited or impaired thumb radial and palmar abduction. Goal status: INITIAL  2.  Left shoulder active range of motion increased to within functional limits to be able to initiate strengthening Baseline: Only passive and active assisted range of motion in supine for shoulder flexion and passive range of motion for shoulder abduction in supine to 90-surgery 05-17-2023 Goal status: INITIAL  3.  Left elbow flexion and extension improve to within functional limits for patient to reach down to feet as well as washing comb hair. Baseline: Elbow extension coming in -50 and during session after heat in supine with active assisted range of motion -38, flexion 130 degrees. Goal status: INITIAL  4.  Upgrade goals as patient is progressing with fracture healing and radial nerve Baseline: Patient only had surgery about 12  days ago. Goal status: INITIAL  5.  Patient to be independent in scar mobilization to increase motion and elbow flexion and extension. Baseline: Steri-Strips still in place on incision over midshaft of humerus Goal status: INITIAL  ASSESSMENT:  CLINICAL IMPRESSION: Patient seen today for occupational therapy evaluation for left midshaft humerus fracture with ORIF by Dr. Carola Frost patient also has a radial nerve injury from fall.  Patient  present today at evaluation with no compression or sling on.  Wrist brace in place.  Patient reports edema improved greatly.  Patient still have nerve pain from posterior upper arm over elbow on dorsal forearm to hand.  Patient limited in shoulder and elbow range of motion.  Contact and got a verbal order from Dr. Carola Frost for active assisted and passive range of motion for shoulder flexion.  Only passive range of motion for shoulder abduction in supine not against gravity.  Can initiate elbow and wrist range of motion.  Patient still Steri-Strips on incision.  Patient supination pronation within functional limits as well as radial and ulnar deviation on table.  Patient wrist extension appeared to be about 2/5 strength with increased stiffness because of not wearing her wrist brace.  Unable to extend digits as well as thumb palmar radial abduction impaired.  Patient was educated in protocol for rehab as well as radial nerve healing.  Info was provided for a prefab radial nerve splint for patient to order an OT will fit patient and educated on how to use during the day to increase functional use.  Patient to wear compression still as needed for edema.  Patient to wear wrist splint at nighttime to prevent overstretch of wrist extensors and tightness in flexors of forearm.  Patient limited in functional use of left upper extremity because of fracture and radial nerve injury patient with decreased range of motion and strength as well as nerve injury.  Patient can benefit from skilled OT services to increase motion increase strength to return to prior level of function.  PERFORMANCE DEFICITS: in functional skills including ADLs, IADLs, coordination, sensation, edema, ROM, strength, pain, flexibility, decreased knowledge of precautions, decreased knowledge of use of DME, and UE functional use,     IMPAIRMENTS: are limiting patient from ADLs, IADLs, rest and sleep, work, play, leisure, and social participation.    COMORBIDITIES: has no other co-morbidities that affects occupational performance. Patient will benefit from skilled OT to address above impairments and improve overall function.  MODIFICATION OR ASSISTANCE TO COMPLETE EVALUATION: Min-Moderate modification of tasks or assist with assess necessary to complete an evaluation.  OT OCCUPATIONAL PROFILE AND HISTORY: Detailed assessment: Review of records and additional review of physical, cognitive, psychosocial history related to current functional performance.  CLINICAL DECISION MAKING: Moderate - several treatment options, min-mod task modification necessary  REHAB POTENTIAL: Good  EVALUATION COMPLEXITY: Moderate      PLAN:  OT FREQUENCY: 1-2x/week  OT DURATION: 12 weeks  PLANNED INTERVENTIONS: 97535 self care/ADL training, 28413 therapeutic exercise, 97140 manual therapy, 97039 fluidotherapy, 97010 moist heat, 97034 contrast bath, scar mobilization, passive range of motion, patient/family education, and DME and/or AE instructions     CONSULTED AND AGREED WITH PLAN OF CARE: Patient     Oletta Cohn, OTR/L,CLT 05/29/2023, 6:55 PM

## 2023-05-31 ENCOUNTER — Ambulatory Visit: Payer: BC Managed Care – PPO | Admitting: Occupational Therapy

## 2023-05-31 DIAGNOSIS — G5632 Lesion of radial nerve, left upper limb: Secondary | ICD-10-CM

## 2023-05-31 DIAGNOSIS — M25632 Stiffness of left wrist, not elsewhere classified: Secondary | ICD-10-CM | POA: Diagnosis not present

## 2023-05-31 DIAGNOSIS — M6281 Muscle weakness (generalized): Secondary | ICD-10-CM | POA: Diagnosis not present

## 2023-05-31 DIAGNOSIS — M25622 Stiffness of left elbow, not elsewhere classified: Secondary | ICD-10-CM | POA: Diagnosis not present

## 2023-05-31 DIAGNOSIS — M25612 Stiffness of left shoulder, not elsewhere classified: Secondary | ICD-10-CM | POA: Diagnosis not present

## 2023-05-31 DIAGNOSIS — L905 Scar conditions and fibrosis of skin: Secondary | ICD-10-CM

## 2023-05-31 NOTE — Therapy (Signed)
OUTPATIENT OCCUPATIONAL THERAPY ORTHO TREATMENT  Patient Name: Marissa Harrison MRN: [id number] DOB:January 04, 1976, 47 y.o., female Today's Date: 05/31/2023  PCP: Marissa Penna NP REFERRING PROVIDER: Dr Marissa Harrison  END OF SESSION:  OT End of Session - 05/31/23 1709     Visit Number 2    Number of Visits 24    Date for OT Re-Evaluation 08/21/23    OT Start Time 1448    OT Stop Time 1540    OT Time Calculation (min) 52 min    Activity Tolerance Patient tolerated treatment well    Behavior During Therapy Marissa Harrison for tasks assessed/performed             Past Medical History:  Diagnosis Date   HSV infection    Past Surgical History:  Procedure Laterality Date   BREAST BIOPSY Right 04/13/2017   BENIGN NODULAR ADENOSIS, wing shaped marker   BREAST BIOPSY Left 11/14/2022   [place] Core Bx Ribbon clip path pending   BREAST BIOPSY Left 11/14/2022   [place] LT BREAST BX W LOC DEV 1ST LESION IMG BX SPEC US GUIDE 11/14/2022 [facility]   BREAST CYST ASPIRATION Right 2007   neg   CESAREAN SECTION  1992   ORIF HUMERUS FRACTURE Left 05/17/2023   Procedure: OPEN REDUCTION INTERNAL FIXATION (ORIF) LEFT HUMERUS FRACTURE;  Surgeon: Marissa Galas, MD;  Location: [facility] OR;  Service: Orthopedics;  Laterality: Left;   TUBAL LIGATION  2003   There are no problems to display for this patient.   ONSET DATE: 05/17/23  REFERRING DIAG: L humerus shaft fx with ORIF and Radial N injury  THERAPY DIAG:  Radial nerve palsy, left  Stiffness of left elbow, not elsewhere classified  Muscle weakness (generalized)  Stiffness of left wrist, not elsewhere classified  Stiffness of left shoulder, not elsewhere classified  Scar condition and fibrosis of skin  Rationale for Evaluation and Treatment: Rehabilitation  SUBJECTIVE:   SUBJECTIVE STATEMENT: I here some popping in my elbow and wrist -wrist splint should be in on Sat- did wear my brace more since last time on my wrist - compression not as much  Pt accompanied  by: self  PERTINENT HISTORY:  OP NOTE Marissa Harrison on 05/17/23: PRE-OPERATIVE DIAGNOSIS:   1. LEFT HUMERAL SHAFT FRACTURE 2. RADIAL NERVE INJURY   POST-OPERATIVE DIAGNOSIS:   1. LEFT HUMERAL SHAFT FRACTURE 2. RADIAL NERVE INJURED BUT LARGELY INTACT   PROCEDURE:  Procedure(s): 1. OPEN REDUCTION INTERNAL FIXATION (ORIF) LEFT HUMERAL SHAFT FRACTURE  2. EXPLORATION AND NEUROPLASTY OF RADIAL NERVE      BRIEF SUMMARY AND INDICATIONS FOR PROCEDURE:  Marissa Harrison is a 48 y.o. who sustained fall resulting in displaced transverse humeral shaft fracture and complete radial nerve deficit.    PRECAUTIONS: No AROM shoulder ABD - not even gravity; AAROM and PROM for wrist , elbow and shoulder flexion; NWB ; Radial N injury     WEIGHT BEARING RESTRICTIONS: No  PAIN:  Are you having pain? Nerve pain posterior upper arm , elbow down to dorsal forearm and hand - 3/10 FALLS: Has patient fallen in last 6 months? Yes. 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  And alsothis nerve injury.  NEXT MD VISIT: 13th Nov  OBJECTIVE:  Note: Objective measures were completed at  Evaluation unless otherwise noted.  HAND DOMINANCE: Right    UPPER EXTREMITY ROM:       Passive ROM Right eval Left eval L 05/31/23  Shoulder flexion  140 supine 120 PROM supine  Shoulder abduction  PROM 90 supine PROM 90 supine  Shoulder adduction     Shoulder extension     Shoulder internal rotation     Shoulder external rotation     Elbow flexion  130 AAROM 140 AAROM  Elbow extension  -50 coming in - in session -38 AAROM supine after heat -38 coming in - in session -25   Wrist flexion  90   Wrist extension  On table- without gravity - to neutral with AAROM    Wrist ulnar deviation  On table - 30   Wrist radial  deviation  On table - 20   Wrist pronation  90   Wrist supination  90   (Blank rows = not tested)  Active ROM Right eval Left eval  Thumb MCP (0-60)    Thumb IP (0-80)    Thumb Radial abd/add (0-55)  Decrease at [facility]    Thumb Palmar abd/add (0-45)  Unable to maintain - drop into flexion    Thumb Opposition to Small Finger  Unable    Index MCP (0-90)     Index PIP (0-100)     Index DIP (0-70)      Long MCP (0-90)      Long PIP (0-100)      Long DIP (0-70)      Ring MCP (0-90)      Ring PIP (0-100)      Ring DIP (0-70)      Little MCP (0-90)      Little PIP (0-100)      Little DIP (0-70)      Pt can make fist - unable to extend digits- if [facility] support at [facility] - can extend PIP's    UPPER EXTREMITY MMT:     MMT Right eval Left eval  Shoulder flexion    Shoulder abduction    Shoulder adduction    Shoulder extension    Shoulder internal rotation    Shoulder external rotation    Middle trapezius    Lower trapezius    Elbow flexion    Elbow extension    Wrist flexion    Wrist extension    Wrist ulnar deviation    Wrist radial deviation    Wrist pronation    Wrist supination    (Blank rows = not tested)  HAND FUNCTION: NT - Surgery 05/17/23  COORDINATION: Radial N injury - impaired   SENSATION: Numbness and burning pain reported posterior upper arm, elbow over dorsal forearm into hand   EDEMA: per pt better than it was last week - done contrast following OT instructions last week and wore tubigip - removed it   COGNITION: Overall cognitive status: Within functional limits for tasks assessed      TODAY'S TREATMENT:  DATE: 05/31/23  Pt arrive more than 1/2 of sterri strips off - removed last with no issues- pt ed on scar massage and cica scar pad provided to wear night time  Fitted with tubigrip E for hand to elbow and F for mid forearm  to axilla - but husband to assist pt and no pushing or pulling  Patient to continue with contrast if increased edema in upper arm, elbow through hand. Patient to do 2-3 times a day per Marissa Harrison order active assisted range Marissa Harrison range of motion in supine for shoulder flexion and abduction only passive range of motion to about 90- 10 reps reps Patient to keep it under 2/10 pain In supine after some moist heat to elbow active assisted range of motion for elbow flexion extension in 3 positions 5 reps Slight pull keeping it under 2/10  Active range of motion for supination pronation 10 reps On table active assisted range of motion for ulnar radial deviation 10 reps Wrist extension tight - pt do report wearing her wrist splint more  Wrist ext PROM done - prior to active assisted range of motion/ passive range of motion for wrist extension with attempts to do active motion on table sliding 10 reps And relax with gravity - holding forearm in L hand   Active assisted range of motion for thumb palmar radial abduction 10 reps on table  Patient info provided for a prefab radial nerve splint to facilitate gripping and opening hand for functional use. Pt report did order - should be in Sat To bring next session  REINFORCE with pt again - PROM only in supine for shoulder flexion and ABD   Pt appear to have done AROM for shoulder again since eval to shoulder - report popping at elbow and at wrist - when doing PROM by OT and support elbow and wrist - no popping Patient also recommended to wear wrist splint when sleeping as well as during the day to prevent overstretching of wrist extensors and popping at wrist.    PATIENT EDUCATION: Education details: Findings of evaluation and home program as well as splint wearing Person educated: Patient Education method: Explanation, Demonstration, Tactile cues, Verbal cues, and Handouts Education comprehension: verbalized understanding, returned demonstration,  verbal cues required, tactile cues required, and needs further education   GOALS: Goals reviewed with patient? Yes  SHORT TERM GOALS: Target date: 4 wks   Patient to be independent and wearing splints correctly to prevent flexion contractures as well as facilitating functional use of left hand Baseline: Patient arrived with wrist splint on but report not really wearing it.  Patient with some discomfort at wrist as well as tightness with wrist extension.  Patient to wear wrist splint when doing elbow and shoulder exercises.  Info provided to order prefab radial nerve splint. Goal status: INITIAL  2.  Patient to be independent in home program to initiate passive range of motion and active assisted range of motion to left shoulder to prevent stiffness until next appointment with surgeon Baseline: Patient reports she has been doing active range of motion with shoulder overhead ;not wearing a sling or compression and not really wearing any more wrist splint or  sleeping with her splint Goal status: INITIAL NITIAL  LONG TERM GOALS: Target date: 12 wks  Patient to be independent in home program and progression as radial nerve improves to facilitate wrist extension and thumb and digit extension. Baseline: Stiffness in wrist extension.  No wrist extension against gravity.  Partial  active assisted range of motion to neutral without gravity on table no digit extension and limited or impaired thumb radial and palmar abduction. Goal status: INITIAL  2.  Left shoulder active range of motion increased to within functional limits to be able to initiate strengthening Baseline: Only passive and active assisted range of motion in supine for shoulder flexion and passive range of motion for shoulder abduction in supine to 90-surgery 05-17-2023 Goal status: INITIAL  3.  Left elbow flexion and extension improve to within functional limits for patient to reach down to feet as well as washing comb hair. Baseline:  Elbow extension coming in -50 and during session after heat in supine with active assisted range of motion -38, flexion 130 degrees. Goal status: INITIAL  4.  Upgrade goals as patient is progressing with fracture healing and radial nerve Baseline: Patient only had surgery about 12 days ago. Goal status: INITIAL  5.  Patient to be independent in scar mobilization to increase motion and elbow flexion and extension. Baseline: Steri-Strips still in place on incision over midshaft of humerus Goal status: INITIAL  ASSESSMENT:  CLINICAL IMPRESSION: Patient seen today for occupational therapy evaluation for left midshaft humerus fracture with ORIF by Marissa. Carola Harrison patient also has a radial nerve injury from fall.  Patient present today at evaluation with no compression or sling on.  Wrist brace in place.  Patient reports edema improved greatly.  Patient still have nerve pain from posterior upper arm over elbow on dorsal forearm to hand.  Patient limited in shoulder and elbow range of motion.  Contact at eval got a verbal order from Marissa. [facility] for active assisted and passive range of motion for shoulder flexion.  Only passive range of motion for shoulder abduction in supine not against gravity. Appear pt done again some AROM for shoulder and reaching at the office - REINFORCE again - PROM only in supine -  Sterri strips off and initiate scar mobs today and cica scar pd provided for night time.  Patient supination pronation within functional limits as well as radial and ulnar deviation on table.  Patient wrist extension appeared to be about 2/5 strength with increased tightness because of not wearing her wrist brace at eval- encourage pt to wear most all the time and up and about.  Unable to extend digits as well as thumb palmar radial abduction impaired.  Patient was educated in protocol for rehab as well as radial nerve healing.  Info was provided for a prefab radial nerve splint for patient to order an OT will fit  patient and educated on how to use during the day to increase functional use.  Patient to wear compression still as needed for edema.  Patient to wear wrist splint at nighttime  and daytime to prevent overstretch of wrist extensors and tightness in flexors of forearm.  Patient limited in functional use of left upper extremity because of fracture and radial nerve injury patient with decreased range of motion and strength as well as nerve injury.  Patient can benefit from skilled OT services to increase motion increase strength to return to prior level of function.  PERFORMANCE DEFICITS: in functional skills including ADLs, IADLs, coordination, sensation, edema, ROM, strength, pain, flexibility, decreased knowledge of precautions, decreased knowledge of use of DME, and UE functional use,     IMPAIRMENTS: are limiting patient from ADLs, IADLs, rest and sleep, work, play, leisure, and social participation.   COMORBIDITIES: has no other co-morbidities that affects occupational performance. Patient will  benefit from skilled OT to address above impairments and improve overall function.  MODIFICATION OR ASSISTANCE TO COMPLETE EVALUATION: Min-Moderate modification of tasks or assist with assess necessary to complete an evaluation.  OT OCCUPATIONAL PROFILE AND HISTORY: Detailed assessment: Review of records and additional review of physical, cognitive, psychosocial history related to current functional performance.  CLINICAL DECISION MAKING: Moderate - several treatment options, min-mod task modification necessary  REHAB POTENTIAL: Good  EVALUATION COMPLEXITY: Moderate      PLAN:  OT FREQUENCY: 1-2x/week  OT DURATION: 12 weeks  PLANNED INTERVENTIONS: 97535 self care/ADL training, 25366 therapeutic exercise, 97140 manual therapy, 97039 fluidotherapy, 97010 moist heat, 97034 contrast bath, scar mobilization, passive range of motion, patient/family education, and DME and/or AE  instructions     CONSULTED AND AGREED WITH PLAN OF CARE: Patient     Oletta Cohn, OTR/L,CLT 05/31/2023, 5:11 PM

## 2023-06-04 ENCOUNTER — Ambulatory Visit: Payer: BC Managed Care – PPO | Attending: Orthopedic Surgery | Admitting: Occupational Therapy

## 2023-06-04 DIAGNOSIS — M25612 Stiffness of left shoulder, not elsewhere classified: Secondary | ICD-10-CM | POA: Diagnosis not present

## 2023-06-04 DIAGNOSIS — L905 Scar conditions and fibrosis of skin: Secondary | ICD-10-CM

## 2023-06-04 DIAGNOSIS — M6281 Muscle weakness (generalized): Secondary | ICD-10-CM

## 2023-06-04 DIAGNOSIS — G5632 Lesion of radial nerve, left upper limb: Secondary | ICD-10-CM

## 2023-06-04 DIAGNOSIS — M25632 Stiffness of left wrist, not elsewhere classified: Secondary | ICD-10-CM | POA: Diagnosis not present

## 2023-06-04 DIAGNOSIS — M25622 Stiffness of left elbow, not elsewhere classified: Secondary | ICD-10-CM

## 2023-06-04 NOTE — Therapy (Signed)
OUTPATIENT OCCUPATIONAL THERAPY ORTHO TREATMENT  Patient Name: Marissa Harrison MRN: [id number] DOB:1976-06-23, 47 y.o., female Today's Date: 06/04/2023  PCP: Marissa Penna NP REFERRING PROVIDER: Dr Marissa Harrison  END OF SESSION:  OT End of Session - 06/04/23 1346     Visit Number 3    Number of Visits 24    Date for OT Re-Evaluation 08/21/23    OT Start Time 1346    OT Stop Time 1440    OT Time Calculation (min) 54 min    Activity Tolerance Patient tolerated treatment well    Behavior During Therapy Marissa Harrison for tasks assessed/performed             Past Medical History:  Diagnosis Date   HSV infection    Past Surgical History:  Procedure Laterality Date   BREAST BIOPSY Right 04/13/2017   BENIGN NODULAR ADENOSIS, wing shaped marker   BREAST BIOPSY Left 11/14/2022   [place] Core Bx Ribbon clip path pending   BREAST BIOPSY Left 11/14/2022   [place] LT BREAST BX W LOC DEV 1ST LESION IMG BX SPEC US GUIDE 11/14/2022 [facility]   BREAST CYST ASPIRATION Right 2007   neg   CESAREAN SECTION  1992   ORIF HUMERUS FRACTURE Left 05/17/2023   Procedure: OPEN REDUCTION INTERNAL FIXATION (ORIF) LEFT HUMERUS FRACTURE;  Surgeon: Marissa Galas, MD;  Location: [facility] OR;  Service: Orthopedics;  Laterality: Left;   TUBAL LIGATION  2003   There are no problems to display for this patient.   ONSET DATE: 05/17/23  REFERRING DIAG: L humerus shaft fx with ORIF and Radial N injury  THERAPY DIAG:  Radial nerve palsy, left  Stiffness of left elbow, not elsewhere classified  Muscle weakness (generalized)  Stiffness of left wrist, not elsewhere classified  Stiffness of left shoulder, not elsewhere classified  Scar condition and fibrosis of skin  Rationale for Evaluation and Treatment: Rehabilitation  SUBJECTIVE:   SUBJECTIVE STATEMENT: I here some popping in my elbow and wrist -wrist splint should be in on Sat- did wear my brace more since last time on my wrist - compression not as much  Pt accompanied by:  self  PERTINENT HISTORY:  Marissa Harrison on 05/17/23: PRE-OPERATIVE DIAGNOSIS:   1. LEFT HUMERAL SHAFT FRACTURE 2. RADIAL NERVE INJURY   POST-OPERATIVE DIAGNOSIS:   1. LEFT HUMERAL SHAFT FRACTURE 2. RADIAL NERVE INJURED BUT LARGELY INTACT   PROCEDURE:  Procedure(s): 1. OPEN REDUCTION INTERNAL FIXATION (ORIF) LEFT HUMERAL SHAFT FRACTURE  2. EXPLORATION AND NEUROPLASTY OF RADIAL NERVE      BRIEF SUMMARY AND INDICATIONS FOR PROCEDURE:  Marissa Harrison is a 47 y.o. who sustained fall resulting in displaced transverse humeral shaft fracture and complete radial nerve deficit.    PRECAUTIONS: No AROM shoulder ABD - not even gravity; AAROM and PROM for wrist , elbow and shoulder flexion; NWB ; Radial N injury     WEIGHT BEARING RESTRICTIONS: No  PAIN:  Are you having pain? Nerve pain posterior upper arm , elbow down to dorsal forearm and hand - 3/10 FALLS: Has patient fallen in last 6 months? Yes. 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  And alsothis nerve injury.  NEXT MD VISIT: 13th Nov  OBJECTIVE:  Note: Objective measures were completed at  Evaluation unless otherwise noted.  HAND DOMINANCE: Right    UPPER EXTREMITY ROM:       Passive ROM Right eval Left eval L 05/31/23  Shoulder flexion  140 supine 120 PROM supine  Shoulder abduction  PROM 90 supine PROM 90 supine  Shoulder adduction     Shoulder extension     Shoulder internal rotation     Shoulder external rotation     Elbow flexion  130 AAROM 140 AAROM  Elbow extension  -50 coming in - in session -38 AAROM supine after heat -38 coming in - in session -25   Wrist flexion  90   Wrist extension  On table- without gravity - to neutral with AAROM    Wrist ulnar deviation  On table - 30   Wrist radial deviation   On table - 20   Wrist pronation  90   Wrist supination  90   (Blank rows = not tested)  Active ROM Right eval Left eval  Thumb MCP (0-60)    Thumb IP (0-80)    Thumb Radial abd/add (0-55)  Decrease at [facility]    Thumb Palmar abd/add (0-45)  Unable to maintain - drop into flexion    Thumb Opposition to Small Finger  Unable    Index MCP (0-90)     Index PIP (0-100)     Index DIP (0-70)      Long MCP (0-90)      Long PIP (0-100)      Long DIP (0-70)      Ring MCP (0-90)      Ring PIP (0-100)      Ring DIP (0-70)      Little MCP (0-90)      Little PIP (0-100)      Little DIP (0-70)      Pt can make fist - unable to extend digits- if [facility] support at [facility] - can extend PIP's    UPPER EXTREMITY MMT:     MMT Right eval Left eval  Shoulder flexion    Shoulder abduction    Shoulder adduction    Shoulder extension    Shoulder internal rotation    Shoulder external rotation    Middle trapezius    Lower trapezius    Elbow flexion    Elbow extension    Wrist flexion    Wrist extension    Wrist ulnar deviation    Wrist radial deviation    Wrist pronation    Wrist supination    (Blank rows = not tested)  HAND FUNCTION: NT - Surgery 05/17/23  COORDINATION: Radial N injury - impaired   SENSATION: Numbness and burning pain reported posterior upper arm, elbow over dorsal forearm into hand   EDEMA: per pt better than it was last week - done contrast following OT instructions last week and wore tubigip - removed it   COGNITION: Overall cognitive status: Within functional limits for tasks assessed      TODAY'S TREATMENT:  DATE: 05/31/23  Pt arrive more than 1/2 of sterri strips off - removed last with no issues- pt ed on scar massage and cica scar pad provided to wear night time  Fitted with tubigrip E for hand to elbow and F for mid forearm to axilla -  but husband to assist pt and no pushing or pulling  Patient to continue with contrast if increased edema in upper arm, elbow through hand. Patient to do 2-3 times a day per Marissa Harrison order active assisted range Marissa Harrison range of motion in supine for shoulder flexion and abduction only passive range of motion to about 90- 10 reps reps Patient to keep it under 2/10 pain In supine after some moist heat to elbow active assisted range of motion for elbow flexion extension in 3 positions 5 reps Slight pull keeping it under 2/10  Active range of motion for supination pronation 10 reps On table active assisted range of motion for ulnar radial deviation 10 reps Wrist extension tight - pt do report wearing her wrist splint more  Wrist ext PROM done - prior to active assisted range of motion/ passive range of motion for wrist extension with attempts to do active motion on table sliding 10 reps And relax with gravity - holding forearm in L hand   Active assisted range of motion for thumb palmar radial abduction 10 reps on table  Patient info provided for a prefab radial nerve splint to facilitate gripping and opening hand for functional use. Pt report did order - should be in Sat To bring next session  REINFORCE with pt again - PROM only in supine for shoulder flexion and ABD   Pt appear to have done AROM for shoulder again since eval to shoulder - report popping at elbow and at wrist - when doing PROM by OT and support elbow and wrist - no popping Patient also recommended to wear wrist splint when sleeping as well as during the day to prevent overstretching of wrist extensors and popping at wrist.    PATIENT EDUCATION: Education details: Findings of evaluation and home program as well as splint wearing Person educated: Patient Education method: Explanation, Demonstration, Tactile cues, Verbal cues, and Handouts Education comprehension: verbalized understanding, returned demonstration, verbal cues  required, tactile cues required, and needs further education   GOALS: Goals reviewed with patient? Yes  SHORT TERM GOALS: Target date: 4 wks   Patient to be independent and wearing splints correctly to prevent flexion contractures as well as facilitating functional use of left hand Baseline: Patient arrived with wrist splint on but report not really wearing it.  Patient with some discomfort at wrist as well as tightness with wrist extension.  Patient to wear wrist splint when doing elbow and shoulder exercises.  Info provided to order prefab radial nerve splint. Goal status: INITIAL  2.  Patient to be independent in home program to initiate passive range of motion and active assisted range of motion to left shoulder to prevent stiffness until next appointment with surgeon Baseline: Patient reports she has been doing active range of motion with shoulder overhead ;not wearing a sling or compression and not really wearing any more wrist splint or  sleeping with her splint Goal status: INITIAL NITIAL  LONG TERM GOALS: Target date: 12 wks  Patient to be independent in home program and progression as radial nerve improves to facilitate wrist extension and thumb and digit extension. Baseline: Stiffness in wrist extension.  No wrist extension against gravity.  Partial  active assisted range of motion to neutral without gravity on table no digit extension and limited or impaired thumb radial and palmar abduction. Goal status: INITIAL  2.  Left shoulder active range of motion increased to within functional limits to be able to initiate strengthening Baseline: Only passive and active assisted range of motion in supine for shoulder flexion and passive range of motion for shoulder abduction in supine to 90-surgery 05-17-2023 Goal status: INITIAL  3.  Left elbow flexion and extension improve to within functional limits for patient to reach down to feet as well as washing comb hair. Baseline: Elbow  extension coming in -50 and during session after heat in supine with active assisted range of motion -38, flexion 130 degrees. Goal status: INITIAL  4.  Upgrade goals as patient is progressing with fracture healing and radial nerve Baseline: Patient only had surgery about 12 days ago. Goal status: INITIAL  5.  Patient to be independent in scar mobilization to increase motion and elbow flexion and extension. Baseline: Steri-Strips still in place on incision over midshaft of humerus Goal status: INITIAL  ASSESSMENT:  CLINICAL IMPRESSION: Patient seen today for occupational therapy evaluation for left midshaft humerus fracture with ORIF by Marissa. Carola Harrison patient also has a radial nerve injury from fall.  Patient present today at evaluation with no compression or sling on.  Wrist brace in place.  Patient reports edema improved greatly.  Patient still have nerve pain from posterior upper arm over elbow on dorsal forearm to hand.  Patient limited in shoulder and elbow range of motion.  Contact at eval got a verbal order from Marissa. Carola Harrison for active assisted and passive range of motion for shoulder flexion.  Only passive range of motion for shoulder abduction in supine not against gravity. Appear pt done again some AROM for shoulder and reaching at the office - REINFORCE again - PROM only in supine -  Sterri strips off and initiate scar mobs today and cica scar pd provided for night time.  Patient supination pronation within functional limits as well as radial and ulnar deviation on table.  Patient wrist extension appeared to be about 2/5 strength with increased tightness because of not wearing her wrist brace at eval- encourage pt to wear most all the time and up and about.  Unable to extend digits as well as thumb palmar radial abduction impaired.  Patient was educated in protocol for rehab as well as radial nerve healing.  Info was provided for a prefab radial nerve splint for patient to order an OT will fit  patient and educated on how to use during the day to increase functional use.  Patient to wear compression still as needed for edema.  Patient to wear wrist splint at nighttime  and daytime to prevent overstretch of wrist extensors and tightness in flexors of forearm.  Patient limited in functional use of left upper extremity because of fracture and radial nerve injury patient with decreased range of motion and strength as well as nerve injury.  Patient can benefit from skilled OT services to increase motion increase strength to return to prior level of function.  PERFORMANCE DEFICITS: in functional skills including ADLs, IADLs, coordination, sensation, edema, ROM, strength, pain, flexibility, decreased knowledge of precautions, decreased knowledge of use of DME, and UE functional use,     IMPAIRMENTS: are limiting patient from ADLs, IADLs, rest and sleep, work, play, leisure, and social participation.   COMORBIDITIES: has no other co-morbidities that affects occupational performance. Patient will  benefit from skilled OT to address above impairments and improve overall function.  MODIFICATION OR ASSISTANCE TO COMPLETE EVALUATION: Min-Moderate modification of tasks or assist with assess necessary to complete an evaluation.  OT OCCUPATIONAL PROFILE AND HISTORY: Detailed assessment: Review of records and additional review of physical, cognitive, psychosocial history related to current functional performance.  CLINICAL DECISION MAKING: Moderate - several treatment options, min-mod task modification necessary  REHAB POTENTIAL: Good  EVALUATION COMPLEXITY: Moderate      PLAN:  OT FREQUENCY: 1-2x/week  OT DURATION: 12 weeks  PLANNED INTERVENTIONS: 97535 self care/ADL training, 52841 therapeutic exercise, 97140 manual therapy, 97039 fluidotherapy, 97010 moist heat, 97034 contrast bath, scar mobilization, passive range of motion, patient/family education, and DME and/or AE  instructions     CONSULTED AND AGREED WITH PLAN OF CARE: Patient     Oletta Cohn, OTR/L,CLT 06/04/2023, 7:01 PM   OUTPATIENT OCCUPATIONAL THERAPY ORTHO TREATMENT  Patient Name: Marissa Harrison MRN: [id number] DOB:1976/01/10, 47 y.o., female Today's Date: 06/04/2023  PCP: Marissa Penna NP REFERRING PROVIDER: Dr Marissa Harrison  END OF SESSION:  OT End of Session - 06/04/23 1346     Visit Number 3    Number of Visits 24    Date for OT Re-Evaluation 08/21/23    OT Start Time 1346    OT Stop Time 1440    OT Time Calculation (min) 54 min    Activity Tolerance Patient tolerated treatment well    Behavior During Therapy [facility] for tasks assessed/performed             Past Medical History:  Diagnosis Date   HSV infection    Past Surgical History:  Procedure Laterality Date   BREAST BIOPSY Right 04/13/2017   BENIGN NODULAR ADENOSIS, wing shaped marker   BREAST BIOPSY Left 11/14/2022   [place] Core Bx Ribbon clip path pending   BREAST BIOPSY Left 11/14/2022   [place] LT BREAST BX W LOC DEV 1ST LESION IMG BX SPEC US GUIDE 11/14/2022 [facility]   BREAST CYST ASPIRATION Right 2007   neg   CESAREAN SECTION  1992   ORIF HUMERUS FRACTURE Left 05/17/2023   Procedure: OPEN REDUCTION INTERNAL FIXATION (ORIF) LEFT HUMERUS FRACTURE;  Surgeon: Marissa Galas, MD;  Location: [facility] OR;  Service: Orthopedics;  Laterality: Left;   TUBAL LIGATION  2003   There are no problems to display for this patient.   ONSET DATE: 05/17/23  REFERRING DIAG: L humerus shaft fx with ORIF and Radial N injury  THERAPY DIAG:  Radial nerve palsy, left  Stiffness of left elbow, not elsewhere classified  Muscle weakness (generalized)  Stiffness of left wrist, not elsewhere classified  Stiffness of left shoulder, not elsewhere classified  Scar condition and fibrosis of skin  Rationale for Evaluation and Treatment: Rehabilitation  SUBJECTIVE:   SUBJECTIVE STATEMENT: I'm still hearing some popping in my elbow  and wrist -here is the wrist splint - I did wear my brace more since evaluation - compression at night time- done scar massage- I am moving it more than I am suppose to Pt accompanied by: self  PERTINENT HISTORY:  Marissa Harrison on 05/17/23: PRE-OPERATIVE DIAGNOSIS:   1. LEFT HUMERAL SHAFT FRACTURE 2. RADIAL NERVE INJURY   POST-OPERATIVE DIAGNOSIS:   1. LEFT HUMERAL SHAFT FRACTURE 2. RADIAL NERVE INJURED BUT LARGELY INTACT   PROCEDURE:  Procedure(s): 1. OPEN REDUCTION INTERNAL FIXATION (ORIF) LEFT HUMERAL SHAFT FRACTURE  2. EXPLORATION AND NEUROPLASTY OF RADIAL NERVE      BRIEF SUMMARY AND INDICATIONS  FOR PROCEDURE:  ALIZAH SILLS is a 47 y.o. who sustained fall resulting in displaced transverse humeral shaft fracture and complete radial nerve deficit.    PRECAUTIONS: No AROM shoulder ABD - not even gravity; AAROM and PROM for wrist , elbow and shoulder flexion; NWB ; Radial N injury     WEIGHT BEARING RESTRICTIONS: No  PAIN:  Are you having pain? Nerve pain posterior upper arm , elbow down to dorsal forearm and hand - 3/10 FALLS: Has patient fallen in last 6 months? Yes. 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  And alsothis nerve injury.  NEXT MD VISIT: 13th Nov  OBJECTIVE:  Note: Objective measures were completed at Evaluation unless otherwise noted.  HAND DOMINANCE: Right    UPPER EXTREMITY ROM:       Passive ROM Right eval Left eval L 05/31/23 L 06/04/23  Shoulder flexion  140 supine 120 PROM supine PROM 120 supine - popping at times at radial head lowering   Shoulder abduction  PROM 90 supine PROM 90 supine PROM supine 90  Shoulder adduction      Shoulder extension      Shoulder internal rotation      Shoulder external rotation      Elbow flexion   130 AAROM 140 AAROM 140 AAROM  Elbow extension  -50 coming in - in session -38 AAROM supine after heat -38 coming in - in session -25  After heat -20 elbow extention - coming in -35   Wrist flexion  90    Wrist extension  On table- without gravity - to neutral with AAROM     Wrist ulnar deviation  On table - 30    Wrist radial deviation  On table - 20    Wrist pronation  90    Wrist supination  90    (Blank rows = not tested)  Active ROM Right eval Left eval  Thumb MCP (0-60)    Thumb IP (0-80)    Thumb Radial abd/add (0-55)  Decrease at [facility]    Thumb Palmar abd/add (0-45)  Unable to maintain - drop into flexion    Thumb Opposition to Small Finger  Unable    Index MCP (0-90)     Index PIP (0-100)     Index DIP (0-70)      Long MCP (0-90)      Long PIP (0-100)      Long DIP (0-70)      Ring MCP (0-90)      Ring PIP (0-100)      Ring DIP (0-70)      Little MCP (0-90)      Little PIP (0-100)      Little DIP (0-70)      Pt can make fist - unable to extend digits- if [facility] support at [facility] - can extend PIP's    UPPER EXTREMITY MMT:     MMT Right eval Left eval  Shoulder flexion    Shoulder abduction    Shoulder adduction    Shoulder extension    Shoulder internal rotation    Shoulder external rotation    Middle trapezius    Lower trapezius    Elbow flexion    Elbow extension    Wrist  flexion    Wrist extension    Wrist ulnar deviation    Wrist radial deviation    Wrist pronation    Wrist supination    (Blank rows = not tested)  HAND FUNCTION: NT - Surgery 05/17/23  COORDINATION: Radial N injury - impaired   SENSATION: Numbness and burning pain reported posterior upper arm, elbow over dorsal forearm into hand   EDEMA: per pt better than it was last week - done contrast following OT instructions last week and wore tubigip - removed it   COGNITION: Overall cognitive status: Within functional limits for tasks assessed      TODAY'S TREATMENT:                                                                                                                               DATE: 06/01/23  Pt arrive with wrist splint in place  Pt fitted today with prefab radial N palsy splint - adjusted and pt able to pick up and release foam block and small foam block - from top and side grasp and release   Pt ed on donning and doffing  Wearing - slowly increase use - 30 min at time - few times during day  NOT to use for reaching and picking up objects using shoulder   Patient to do 2-3 times a day per Marissa Harrison order active assisted range Marissa Harrison range of motion in supine for shoulder flexion and abduction only passive range of motion to about 90- 10 reps reps Patient to keep it under 2/10 pain In supine after some moist heat to elbow active assisted range of motion for elbow flexion extension in 3 positions 5 reps Slight pull keeping it under 2/10 Improve to -20   Pt to cont wrist and forearm PROM , AAROM  And digits REINFORCE with pt again - PROM only in supine for shoulder flexion and ABD   Pt appear to have done AROM for shoulder again since eval to shoulder - report popping at elbow and at wrist -  this date some increase popping at radius head- with elbow and shoulder AROM -  Message send to Marissa Harrison  Observe pt donning top on pushing L arm into sleeve over head  And sitting up from supine - lightly push on elbow -  REMIND pt again no AROM for shoulder and NO pushing up    PATIENT EDUCATION: Education details: Findings of evaluation and home program as well as splint wearing Person educated: Patient Education method: Explanation, Demonstration, Tactile cues, Verbal cues, and Handouts Education comprehension: verbalized understanding, returned demonstration, verbal cues required, tactile cues required, and needs further education   GOALS: Goals reviewed with patient? Yes  SHORT TERM GOALS: Target date: 4 wks   Patient to be independent and wearing  splints correctly to prevent flexion contractures as well as facilitating functional use of left hand Baseline: Patient arrived with wrist splint on but report not  really wearing it.  Patient with some discomfort at wrist as well as tightness with wrist extension.  Patient to wear wrist splint when doing elbow and shoulder exercises.  Info provided to order prefab radial nerve splint. Goal status: INITIAL  2.  Patient to be independent in home program to initiate passive range of motion and active assisted range of motion to left shoulder to prevent stiffness until next appointment with surgeon Baseline: Patient reports she has been doing active range of motion with shoulder overhead ;not wearing a sling or compression and not really wearing any more wrist splint or  sleeping with her splint Goal status: INITIAL NITIAL  LONG TERM GOALS: Target date: 12 wks  Patient to be independent in home program and progression as radial nerve improves to facilitate wrist extension and thumb and digit extension. Baseline: Stiffness in wrist extension.  No wrist extension against gravity.  Partial active assisted range of motion to neutral without gravity on table no digit extension and limited or impaired thumb radial and palmar abduction. Goal status: INITIAL  2.  Left shoulder active range of motion increased to within functional limits to be able to initiate strengthening Baseline: Only passive and active assisted range of motion in supine for shoulder flexion and passive range of motion for shoulder abduction in supine to 90-surgery 05-17-2023 Goal status: INITIAL  3.  Left elbow flexion and extension improve to within functional limits for patient to reach down to feet as well as washing comb hair. Baseline: Elbow extension coming in -50 and during session after heat in supine with active assisted range of motion -38, flexion 130 degrees. Goal status: INITIAL  4.  Upgrade goals as patient is  progressing with fracture healing and radial nerve Baseline: Patient only had surgery about 12 days ago. Goal status: INITIAL  5.  Patient to be independent in scar mobilization to increase motion and elbow flexion and extension. Baseline: Steri-Strips still in place on incision over midshaft of humerus Goal status: INITIAL  ASSESSMENT:  CLINICAL IMPRESSION: Patient seen today for occupational therapy evaluation for left midshaft humerus fracture with ORIF by Marissa. Carola Harrison patient also has a radial nerve injury from fall.  Patient present today at evaluation with no compression or sling on.  Wrist brace in place.  Patient reports edema improved greatly.  Patient still have nerve pain from posterior upper arm over elbow on dorsal forearm to hand.  Patient limited in shoulder and elbow range of motion.  Contact at eval got a verbal order from Marissa. Carola Harrison for active assisted and passive range of motion for shoulder flexion.  Only passive range of motion for shoulder abduction in supine not against gravity. REINFORCE again - PROM only in supine -  no AROM to shoulder or pushing up on elbow - Pt was fitted today with prefab  radial nerve splint for patient to  use during day - light activities on computer and small objects in reach - no AROM shoulder- increase wearing slow during day - 30 min at time - Static wrist splint at night time to prevent overstretch of wrist extensors and tightness in flexors of forearm.  Patient limited in functional use of left upper extremity because of fracture and radial nerve injury patient with decreased range of motion and strength as well as nerve injury.  Patient can benefit from skilled OT services to increase motion increase strength to return to prior level of function.  PERFORMANCE DEFICITS: in functional skills including ADLs, IADLs, coordination,  sensation, edema, ROM, strength, pain, flexibility, decreased knowledge of precautions, decreased knowledge of use of DME, and  UE functional use,     IMPAIRMENTS: are limiting patient from ADLs, IADLs, rest and sleep, work, play, leisure, and social participation.   COMORBIDITIES: has no other co-morbidities that affects occupational performance. Patient will benefit from skilled OT to address above impairments and improve overall function.  MODIFICATION OR ASSISTANCE TO COMPLETE EVALUATION: Min-Moderate modification of tasks or assist with assess necessary to complete an evaluation.  OT OCCUPATIONAL PROFILE AND HISTORY: Detailed assessment: Review of records and additional review of physical, cognitive, psychosocial history related to current functional performance.  CLINICAL DECISION MAKING: Moderate - several treatment options, min-mod task modification necessary  REHAB POTENTIAL: Good  EVALUATION COMPLEXITY: Moderate      PLAN:  OT FREQUENCY: 1-2x/week  OT DURATION: 12 weeks  PLANNED INTERVENTIONS: 97535 self care/ADL training, 01027 therapeutic exercise, 97140 manual therapy, 97039 fluidotherapy, 97010 moist heat, 97034 contrast bath, scar mobilization, passive range of motion, patient/family education, and DME and/or AE instructions     CONSULTED AND AGREED WITH PLAN OF CARE: Patient     Oletta Cohn, OTR/L,CLT 06/04/2023, 7:01 PM

## 2023-06-11 ENCOUNTER — Ambulatory Visit: Payer: BC Managed Care – PPO | Admitting: Occupational Therapy

## 2023-06-11 DIAGNOSIS — M25632 Stiffness of left wrist, not elsewhere classified: Secondary | ICD-10-CM | POA: Diagnosis not present

## 2023-06-11 DIAGNOSIS — L905 Scar conditions and fibrosis of skin: Secondary | ICD-10-CM | POA: Diagnosis not present

## 2023-06-11 DIAGNOSIS — G5632 Lesion of radial nerve, left upper limb: Secondary | ICD-10-CM | POA: Diagnosis not present

## 2023-06-11 DIAGNOSIS — M25612 Stiffness of left shoulder, not elsewhere classified: Secondary | ICD-10-CM

## 2023-06-11 DIAGNOSIS — M25622 Stiffness of left elbow, not elsewhere classified: Secondary | ICD-10-CM

## 2023-06-11 DIAGNOSIS — M6281 Muscle weakness (generalized): Secondary | ICD-10-CM

## 2023-06-11 NOTE — Therapy (Signed)
OUTPATIENT OCCUPATIONAL THERAPY ORTHO TREATMENT  Patient Name: Marissa Harrison MRN: [id number] DOB:1975-10-23, 47 y.o., female Today's Date: 06/11/2023  PCP: Darrick Penna NP REFERRING PROVIDER: Dr Carola Frost  END OF SESSION:  OT End of Session - 06/11/23 1516     Visit Number 4    Number of Visits 24    Date for OT Re-Evaluation 08/21/23    OT Start Time 1116    OT Stop Time 1202    OT Time Calculation (min) 46 min    Activity Tolerance Patient tolerated treatment well    Behavior During Therapy [facility] for tasks assessed/performed             Past Medical History:  Diagnosis Date   HSV infection    Past Surgical History:  Procedure Laterality Date   BREAST BIOPSY Right 04/13/2017   BENIGN NODULAR ADENOSIS, wing shaped marker   BREAST BIOPSY Left 11/14/2022   [place] Core Bx Ribbon clip path pending   BREAST BIOPSY Left 11/14/2022   [place] LT BREAST BX W LOC DEV 1ST LESION IMG BX SPEC US GUIDE 11/14/2022 [facility]   BREAST CYST ASPIRATION Right 2007   neg   CESAREAN SECTION  1992   ORIF HUMERUS FRACTURE Left 05/17/2023   Procedure: OPEN REDUCTION INTERNAL FIXATION (ORIF) LEFT HUMERUS FRACTURE;  Surgeon: Myrene Galas, MD;  Location: [facility] OR;  Service: Orthopedics;  Laterality: Left;   TUBAL LIGATION  2003   There are no problems to display for this patient.   ONSET DATE: 05/17/23  REFERRING DIAG: L humerus shaft fx with ORIF and Radial N injury  THERAPY DIAG:  Radial nerve palsy, left  Stiffness of left elbow, not elsewhere classified  Muscle weakness (generalized)  Stiffness of left wrist, not elsewhere classified  Stiffness of left shoulder, not elsewhere classified  Scar condition and fibrosis of skin  Rationale for Evaluation and Treatment: Rehabilitation  SUBJECTIVE:   SUBJECTIVE STATEMENT: The beach was so nice.  I done my elbow exercises and hand exercises more than my shoulder I did in the evenings.  Not good with my scar massage.  Been wearing the hand and  wrist brace about 4 x 30 minutes.  The nerve pain and the burning and stinging is just bothersome  pt accompanied by: self  PERTINENT HISTORY:  OP NOTE DR HANDY on 05/17/23: PRE-OPERATIVE DIAGNOSIS:   1. LEFT HUMERAL SHAFT FRACTURE 2. RADIAL NERVE INJURY   POST-OPERATIVE DIAGNOSIS:   1. LEFT HUMERAL SHAFT FRACTURE 2. RADIAL NERVE INJURED BUT LARGELY INTACT   PROCEDURE:  Procedure(s): 1. OPEN REDUCTION INTERNAL FIXATION (ORIF) LEFT HUMERAL SHAFT FRACTURE  2. EXPLORATION AND NEUROPLASTY OF RADIAL NERVE      BRIEF SUMMARY AND INDICATIONS FOR PROCEDURE:  PEPPER GRAMLING is a 47 y.o. who sustained fall resulting in displaced transverse humeral shaft fracture and complete radial nerve deficit.    PRECAUTIONS: No AROM shoulder ABD - not even gravity; AAROM and PROM for wrist , elbow and shoulder flexion; NWB ; Radial N injury     WEIGHT BEARING RESTRICTIONS: No  PAIN:  Are you having pain? Nerve pain posterior upper arm , elbow down to dorsal forearm and hand - 3/10 FALLS: Has patient fallen in last 6 months? Yes. 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  And alsothis nerve injury.  NEXT MD VISIT: 13th Nov  OBJECTIVE:  Note: Objective measures were completed at Evaluation unless otherwise noted.  HAND DOMINANCE: Right    UPPER EXTREMITY ROM:       Passive ROM Right eval Left eval L 05/31/23 L 06/11/23  Shoulder flexion  140 supine 120 PROM supine PROM supine 140  Shoulder abduction  PROM 90 supine PROM 90 supine PROM supine 90  Shoulder adduction      Shoulder extension      Shoulder internal rotation      Shoulder external rotation      Elbow flexion  130 AAROM 140 AAROM 150  Elbow extension  -50 coming in - in session -38 AAROM supine after heat -38 coming in - in  session -25  -25 coming in - session -15 to -20  Wrist flexion  90  80  Wrist extension  On table- without gravity - to neutral with AAROM   AROM without gravity to neutral   Wrist ulnar deviation  On table - 30  3- sliding on paper on table   Wrist radial deviation  On table - 20  20- slding on table on paper  Wrist pronation  90  90  Wrist supination  90  90  (Blank rows = not tested)  Active ROM Right eval Left eval L 06/11/23  Thumb MCP (0-60)     Thumb IP (0-80)     Thumb Radial abd/add (0-55)  Decrease at [facility]   Decrease [facility]   Thumb Palmar abd/add (0-45)  Unable to maintain - drop into flexion   Able to keep in PA 6 reps place and hold- but drop in flexion with ADD or after 6 reps   Thumb Opposition to Small Finger  Unable     Index MCP (0-90)      Index PIP (0-100)      Index DIP (0-70)       Long MCP (0-90)       Long PIP (0-100)       Long DIP (0-70)       Ring MCP (0-90)       Ring PIP (0-100)       Ring DIP (0-70)       Little MCP (0-90)       Little PIP (0-100)       Little DIP (0-70)       Pt can make fist - unable to extend digits- if [facility] support at [facility] - can extend PIP's    UPPER EXTREMITY MMT:     MMT Right eval Left eval  Shoulder flexion    Shoulder abduction    Shoulder adduction    Shoulder extension    Shoulder internal rotation    Shoulder external rotation    Middle trapezius    Lower trapezius    Elbow flexion    Elbow extension    Wrist flexion    Wrist extension    Wrist ulnar deviation    Wrist radial deviation    Wrist pronation    Wrist supination    (Blank rows = not tested)  HAND FUNCTION: NT - Surgery 05/17/23  COORDINATION: Radial N injury - impaired   SENSATION: Numbness and burning pain reported posterior upper arm, elbow over dorsal forearm into hand   EDEMA: per pt better than it was last week - done contrast following OT instructions last week and wore tubigip - removed it  COGNITION: Overall cognitive status:  Within functional limits for tasks assessed      TODAY'S TREATMENT:                                                                                                                              DATE: 06/11/23  Patient arrive with radial palsy splint in place.  Observed patient using left grip carrying something light and able to release with splint on. Patient reports using it since last week few times a day for 30 minutes. Patient continues to report some popping and clicking at the elbow. Patient next appointment with Dr. Carola Frost is Wednesday.  Patient show increased passive range of motion for left shoulder flexion as well as elbow flexion and extension active assisted range of motion.  Done extended soft tissue massage to scar this date as well as bicep to increase elbow extension as well as scar adhesion.   Reviewed with patient again scar massage as well as use of Cica -Care scar pad.   Patient with positive Tinel distal to elbow.   Reports still a lot of nerve pain and discomfort from proximal to elbow to dorsal hand   Done moist heat to elbow in supine prior to active assisted range of motion for elbow flexion extension in 3 positions 10 reps Slight pull keeping it under 1/10  Active range of motion for supination pronation 10 reps On table active assisted range of motion for ulnar radial deviation on paper 10 reps Wrist extension less tightness able to do in neutral wrist extension from flexion to neutral.     Active assisted range of motion for thumb palmar  abduction 10 reps on table Patient able to maintain palmar abduction of thumb with a placing hold of 6 reps against gravity.  But then dropped into flexion. Radial abduction of thumb mostly out of MC decreased CMC      PATIENT EDUCATION: Education details: Findings of evaluation and home program as well as splint wearing Person educated: Patient Education method: Explanation, Demonstration, Tactile cues, Verbal cues,  and Handouts Education comprehension: verbalized understanding, returned demonstration, verbal cues required, tactile cues required, and needs further education   GOALS: Goals reviewed with patient? Yes  SHORT TERM GOALS: Target date: 4 wks   Patient to be independent and wearing splints correctly to prevent flexion contractures as well as facilitating functional use of left hand Baseline: Patient arrived with wrist splint on but report not really wearing it.  Patient with some discomfort at wrist as well as tightness with wrist extension.  Patient to wear wrist splint when doing elbow and shoulder exercises.  Info provided to order prefab radial nerve splint. Goal status: INITIAL  2.  Patient to be independent in home program to initiate passive range of motion and active assisted range of motion to left shoulder to prevent stiffness until next appointment with surgeon Baseline: Patient reports she has been doing active range of motion  with shoulder overhead ;not wearing a sling or compression and not really wearing any more wrist splint or  sleeping with her splint Goal status: INITIAL NITIAL  LONG TERM GOALS: Target date: 12 wks  Patient to be independent in home program and progression as radial nerve improves to facilitate wrist extension and thumb and digit extension. Baseline: Stiffness in wrist extension.  No wrist extension against gravity.  Partial active assisted range of motion to neutral without gravity on table no digit extension and limited or impaired thumb radial and palmar abduction. Goal status: INITIAL  2.  Left shoulder active range of motion increased to within functional limits to be able to initiate strengthening Baseline: Only passive and active assisted range of motion in supine for shoulder flexion and passive range of motion for shoulder abduction in supine to 90-surgery 05-17-2023 Goal status: INITIAL  3.  Left elbow flexion and extension improve to within  functional limits for patient to reach down to feet as well as washing comb hair. Baseline: Elbow extension coming in -50 and during session after heat in supine with active assisted range of motion -38, flexion 130 degrees. Goal status: INITIAL  4.  Upgrade goals as patient is progressing with fracture healing and radial nerve Baseline: Patient only had surgery about 12 days ago. Goal status: INITIAL  5.  Patient to be independent in scar mobilization to increase motion and elbow flexion and extension. Baseline: Steri-Strips still in place on incision over midshaft of humerus Goal status: INITIAL  ASSESSMENT:  CLINICAL IMPRESSION: Patient seen today for occupational therapy evaluation for left midshaft humerus fracture with ORIF by Dr. Carola Frost patient also has a radial nerve injury from fall.  Patient present today at evaluation with no compression or sling on.  Wrist brace in place.  Patient reports edema improved greatly.  Patient still have nerve pain from posterior upper arm over elbow on dorsal forearm to hand.  Patient limited in shoulder and elbow range of motion.  Contact at eval got a verbal order from Dr. Carola Frost for active assisted and passive range of motion for shoulder flexion.  Only passive range of motion for shoulder abduction in supine not against gravity. Appear pt done again some AROM for shoulder and reaching at the office - REINFORCE again - PROM only in supine -patient arrived after being at the beach a week.  Patient arrived with radial nerve palsy splint in place.  Appear patient able to grasp and release light objects.  Patient show increased second digit MC extension and thumb palmar abduction against gravity.  Wrist extension without gravity to neutral.  With less tightness for passive range of motion.  Patient wrist flexion increased to 115 extension to about -22.  Bicep tendon and scar adhesions limiting bicep or elbow extension.  Done in the clinic and reviewed with patient  again as well as C[employer] -Care scar pad provided again to use at night.  Shoulder flexion passive range of motion in supine to about 140 and abduction 90 pain-free.  Patient has a follow-up appointment Dr. Carola Frost on Wednesday.  Reinforced with patient to discuss her popping at the elbow.  Patient to continue wearing wrist brace in between her radial nerve palsy splint to prevent overstretching of wrist extensors.  Patient limited in functional use of left upper extremity because of fracture and radial nerve injury patient with decreased range of motion and strength as well as nerve injury.  Patient can benefit from skilled OT services to increase motion  increase strength to return to prior level of function.  PERFORMANCE DEFICITS: in functional skills including ADLs, IADLs, coordination, sensation, edema, ROM, strength, pain, flexibility, decreased knowledge of precautions, decreased knowledge of use of DME, and UE functional use,     IMPAIRMENTS: are limiting patient from ADLs, IADLs, rest and sleep, work, play, leisure, and social participation.   COMORBIDITIES: has no other co-morbidities that affects occupational performance. Patient will benefit from skilled OT to address above impairments and improve overall function.  MODIFICATION OR ASSISTANCE TO COMPLETE EVALUATION: Min-Moderate modification of tasks or assist with assess necessary to complete an evaluation.  OT OCCUPATIONAL PROFILE AND HISTORY: Detailed assessment: Review of records and additional review of physical, cognitive, psychosocial history related to current functional performance.  CLINICAL DECISION MAKING: Moderate - several treatment options, min-mod task modification necessary  REHAB POTENTIAL: Good  EVALUATION COMPLEXITY: Moderate      PLAN:  OT FREQUENCY: 1-2x/week  OT DURATION: 12 weeks  PLANNED INTERVENTIONS: 97535 self care/ADL training, 40981 therapeutic exercise, 97140 manual therapy, 97039 fluidotherapy, 97010  moist heat, 97034 contrast bath, scar mobilization, passive range of motion, patient/family education, and DME and/or AE instructions     CONSULTED AND AGREED WITH PLAN OF CARE: Patient     Oletta Cohn, OTR/L,CLT 06/11/2023, 3:18 PM        C

## 2023-06-13 DIAGNOSIS — M25532 Pain in left wrist: Secondary | ICD-10-CM | POA: Diagnosis not present

## 2023-06-13 DIAGNOSIS — S42322D Displaced transverse fracture of shaft of humerus, left arm, subsequent encounter for fracture with routine healing: Secondary | ICD-10-CM | POA: Diagnosis not present

## 2023-06-14 ENCOUNTER — Ambulatory Visit: Payer: BC Managed Care – PPO | Admitting: Occupational Therapy

## 2023-06-14 DIAGNOSIS — M25622 Stiffness of left elbow, not elsewhere classified: Secondary | ICD-10-CM

## 2023-06-14 DIAGNOSIS — M6281 Muscle weakness (generalized): Secondary | ICD-10-CM

## 2023-06-14 DIAGNOSIS — M25632 Stiffness of left wrist, not elsewhere classified: Secondary | ICD-10-CM | POA: Diagnosis not present

## 2023-06-14 DIAGNOSIS — L905 Scar conditions and fibrosis of skin: Secondary | ICD-10-CM | POA: Diagnosis not present

## 2023-06-14 DIAGNOSIS — G5632 Lesion of radial nerve, left upper limb: Secondary | ICD-10-CM | POA: Diagnosis not present

## 2023-06-14 DIAGNOSIS — M25612 Stiffness of left shoulder, not elsewhere classified: Secondary | ICD-10-CM | POA: Diagnosis not present

## 2023-06-14 NOTE — Therapy (Signed)
OUTPATIENT OCCUPATIONAL THERAPY ORTHO TREATMENT  Patient Name: Marissa Harrison MRN: [id number] DOB:10/04/1975, 47 y.o., female Today's Date: 06/14/2023  PCP: Darrick Penna NP REFERRING PROVIDER: Dr Carola Frost  END OF SESSION:  OT End of Session - 06/14/23 1838     Visit Number 5    Number of Visits 24    Date for OT Re-Evaluation 08/21/23    OT Start Time 1450    OT Stop Time 1530    OT Time Calculation (min) 40 min    Activity Tolerance Patient tolerated treatment well    Behavior During Therapy [facility] for tasks assessed/performed             Past Medical History:  Diagnosis Date   HSV infection    Past Surgical History:  Procedure Laterality Date   BREAST BIOPSY Right 04/13/2017   BENIGN NODULAR ADENOSIS, wing shaped marker   BREAST BIOPSY Left 11/14/2022   [place] Core Bx Ribbon clip path pending   BREAST BIOPSY Left 11/14/2022   [place] LT BREAST BX W LOC DEV 1ST LESION IMG BX SPEC US GUIDE 11/14/2022 [facility]   BREAST CYST ASPIRATION Right 2007   neg   CESAREAN SECTION  1992   ORIF HUMERUS FRACTURE Left 05/17/2023   Procedure: OPEN REDUCTION INTERNAL FIXATION (ORIF) LEFT HUMERUS FRACTURE;  Surgeon: Myrene Galas, MD;  Location: [facility] OR;  Service: Orthopedics;  Laterality: Left;   TUBAL LIGATION  2003   There are no problems to display for this patient.   ONSET DATE: 05/17/23  REFERRING DIAG: L humerus shaft fx with ORIF and Radial N injury  THERAPY DIAG:  Radial nerve palsy, left  Stiffness of left elbow, not elsewhere classified  Muscle weakness (generalized)  Stiffness of left wrist, not elsewhere classified  Stiffness of left shoulder, not elsewhere classified  Scar condition and fibrosis of skin  Rationale for Evaluation and Treatment: Rehabilitation  SUBJECTIVE:   SUBJECTIVE STATEMENT: Seen DR Carola Frost - xray showed great healing and he was not to worried about the elbow - xray looked good for elbow -  I can now  per him exercise my elbow sitting or standing  - he did give me medication for the nerve pain pt accompanied by: self  PERTINENT HISTORY:  OP NOTE DR HANDY on 05/17/23: PRE-OPERATIVE DIAGNOSIS:   1. LEFT HUMERAL SHAFT FRACTURE 2. RADIAL NERVE INJURY   POST-OPERATIVE DIAGNOSIS:   1. LEFT HUMERAL SHAFT FRACTURE 2. RADIAL NERVE INJURED BUT LARGELY INTACT   PROCEDURE:  Procedure(s): 1. OPEN REDUCTION INTERNAL FIXATION (ORIF) LEFT HUMERAL SHAFT FRACTURE  2. EXPLORATION AND NEUROPLASTY OF RADIAL NERVE      BRIEF SUMMARY AND INDICATIONS FOR PROCEDURE:  Marissa Harrison is a 48 y.o. who sustained fall resulting in displaced transverse humeral shaft fracture and complete radial nerve deficit.    PRECAUTIONS: No AROM shoulder ABD - not even gravity; AAROM and PROM for wrist , elbow and shoulder flexion; NWB ; Radial N injury     WEIGHT BEARING RESTRICTIONS: No  PAIN:  Are you having pain? Nerve pain posterior upper arm , elbow down to dorsal forearm and hand - 3/10 FALLS: Has patient fallen in last 6 months? Yes. 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  And alsothis nerve injury.  NEXT MD VISIT: middle Dec OBJECTIVE:  Note: Objective measures were completed at Evaluation unless otherwise noted.  HAND DOMINANCE: Right    UPPER EXTREMITY ROM:       Passive ROM Right eval Left eval L 05/31/23 L 06/11/23 L 06/14/23  Shoulder flexion  140 supine 120 PROM supine PROM supine 140 AROM 160  Shoulder abduction  PROM 90 supine PROM 90 supine PROM supine 90 95 AROM   Shoulder adduction       Shoulder extension     55  Shoulder internal rotation       Shoulder external rotation       Elbow flexion  130 AAROM 140 AAROM 150 150  Elbow extension  -50 coming in - in session -38 AAROM supine after heat -38 coming in - in session -25   -25 coming in - session -15 to -20 -20 to -25  Wrist flexion  90  80   Wrist extension  On table- without gravity - to neutral with AAROM   AROM without gravity to neutral    Wrist ulnar deviation  On table - 30  3- sliding on paper on table    Wrist radial deviation  On table - 20  20- slding on table on paper   Wrist pronation  90  90   Wrist supination  90  90   (Blank rows = not tested)  Active ROM Right eval Left eval L 06/11/23  Thumb MCP (0-60)     Thumb IP (0-80)     Thumb Radial abd/add (0-55)  Decrease at [facility]   Decrease [facility]   Thumb Palmar abd/add (0-45)  Unable to maintain - drop into flexion   Able to keep in PA 6 reps place and hold- but drop in flexion with ADD or after 6 reps   Thumb Opposition to Small Finger  Unable     Index MCP (0-90)      Index PIP (0-100)      Index DIP (0-70)       Long MCP (0-90)       Long PIP (0-100)       Long DIP (0-70)       Ring MCP (0-90)       Ring PIP (0-100)       Ring DIP (0-70)       Little MCP (0-90)       Little PIP (0-100)       Little DIP (0-70)       Pt can make fist - unable to extend digits- if [facility] support at [facility] - can extend PIP's    UPPER EXTREMITY MMT:     MMT Right eval Left eval  Shoulder flexion    Shoulder abduction    Shoulder adduction    Shoulder extension    Shoulder internal rotation    Shoulder external rotation    Middle trapezius    Lower trapezius    Elbow flexion    Elbow extension    Wrist flexion    Wrist extension    Wrist ulnar deviation    Wrist radial deviation    Wrist pronation    Wrist supination    (Blank rows = not tested)  HAND FUNCTION: NT - Surgery 05/17/23  COORDINATION: Radial N injury - impaired   SENSATION: Numbness and burning pain reported posterior upper arm, elbow over dorsal forearm into hand   EDEMA: per pt better than it was last week - done contrast  following OT instructions last week and wore tubigip - removed it   COGNITION: Overall cognitive  status: Within functional limits for tasks assessed      TODAY'S TREATMENT:                                                                                                                              DATE: 06/14/23  Patient arrive with radial palsy splint in place.  Observed patient using left grip carrying something jacket - able to grasp and release light objects Patient reports using it since last week few times a day for 30 minutes. Patient continues to report some popping and clicking at the elbow. Had appt with DR Carola Frost yesterday and xray showed good healing -and allowed to do AROM in sitting over head for shoulder including Abd Nerve pain - did get prescription for nerve pain   Patient show increased AROM for shoulder flexion and ABD  Scar still tender and adhere  Nerve pain still increase - more today with cold and rainy day    Done moist heat to elbow in supine  for extention stretch  prior to active assisted range of motion for elbow flexion extension in 3 positions 5  reps Slight pull keeping it under 1/10 Then review AAROM interlocking digits or cane for shoulder flexion  And ABD in supine  Great progress and tolerance  Can also do at work [employer] sliding shoulder flexion on wall  And ABD - doing pendulum  As well as standing against wall doing elbow ext 3 position 5 reps each  Keep shoulder back  Cont scapula retraction   Done extended soft tissue massage to scar this date as well as bicep to increase elbow extension as well as scar adhesion.   Reviewed with patient again scar massage as well as use of [employer] -Care scar pad.   Patient with positive Tinel distal to elbow.       Cont with HEP for hand , wrist and forearm       PATIENT EDUCATION: Education details: Findings of evaluation and home program as well as splint wearing Person educated: Patient Education method: Explanation, Demonstration, Tactile cues, Verbal cues, and Handouts Education  comprehension: verbalized understanding, returned demonstration, verbal cues required, tactile cues required, and needs further education   GOALS: Goals reviewed with patient? Yes  SHORT TERM GOALS: Target date: 4 wks   Patient to be independent and wearing splints correctly to prevent flexion contractures as well as facilitating functional use of left hand Baseline: Patient arrived with wrist splint on but report not really wearing it.  Patient with some discomfort at wrist as well as tightness with wrist extension.  Patient to wear wrist splint when doing elbow and shoulder exercises.  Info provided to order prefab radial nerve splint. Goal status: INITIAL  2.  Patient to be independent in home program to initiate passive range of motion and active assisted range of motion to  left shoulder to prevent stiffness until next appointment with surgeon Baseline: Patient reports she has been doing active range of motion with shoulder overhead ;not wearing a sling or compression and not really wearing any more wrist splint or  sleeping with her splint Goal status: INITIAL NITIAL  LONG TERM GOALS: Target date: 12 wks  Patient to be independent in home program and progression as radial nerve improves to facilitate wrist extension and thumb and digit extension. Baseline: Stiffness in wrist extension.  No wrist extension against gravity.  Partial active assisted range of motion to neutral without gravity on table no digit extension and limited or impaired thumb radial and palmar abduction. Goal status: INITIAL  2.  Left shoulder active range of motion increased to within functional limits to be able to initiate strengthening Baseline: Only passive and active assisted range of motion in supine for shoulder flexion and passive range of motion for shoulder abduction in supine to 90-surgery 05-17-2023 Goal status: INITIAL  3.  Left elbow flexion and extension improve to within functional limits for  patient to reach down to feet as well as washing comb hair. Baseline: Elbow extension coming in -50 and during session after heat in supine with active assisted range of motion -38, flexion 130 degrees. Goal status: INITIAL  4.  Upgrade goals as patient is progressing with fracture healing and radial nerve Baseline: Patient only had surgery about 12 days ago. Goal status: INITIAL  5.  Patient to be independent in scar mobilization to increase motion and elbow flexion and extension. Baseline: Steri-Strips still in place on incision over midshaft of humerus Goal status: INITIAL  ASSESSMENT:  CLINICAL IMPRESSION: Patient seen for occupational therapy for left midshaft humerus fracture with ORIF by Dr. Carola Frost patient also has a radial nerve injury from fall.  Pt had appt with DR Carola Frost yesterday -per pt xray looked and showed good healing -can now do AROM for shoulder in sitting - did get medication for nerve pain  Pt show increase AROM for shoulder ABD and flexion -upgrade HEP tolerate well - pt arrive with radial nerve palsy splint in place - able to grasp and release light objects. Pt cont to focus on scar massage and elbow extention - this week show increased second digit MC extension and thumb palmar abduction against gravity.  Wrist extension without gravity to neutral.  With less tightness for passive range of motion.   Patient to continue wearing wrist brace in between her radial nerve palsy splint to prevent overstretching of wrist extensors.  Patient limited in functional use of left upper extremity because of fracture and radial nerve injury patient with decreased range of motion and strength as well as nerve injury.  Patient can benefit from skilled OT services to increase motion increase strength to return to prior level of function.  PERFORMANCE DEFICITS: in functional skills including ADLs, IADLs, coordination, sensation, edema, ROM, strength, pain, flexibility, decreased knowledge of  precautions, decreased knowledge of use of DME, and UE functional use,     IMPAIRMENTS: are limiting patient from ADLs, IADLs, rest and sleep, work, play, leisure, and social participation.   COMORBIDITIES: has no other co-morbidities that affects occupational performance. Patient will benefit from skilled OT to address above impairments and improve overall function.  MODIFICATION OR ASSISTANCE TO COMPLETE EVALUATION: Min-Moderate modification of tasks or assist with assess necessary to complete an evaluation.  OT OCCUPATIONAL PROFILE AND HISTORY: Detailed assessment: Review of records and additional review of physical, cognitive, psychosocial history related  to current functional performance.  CLINICAL DECISION MAKING: Moderate - several treatment options, min-mod task modification necessary  REHAB POTENTIAL: Good  EVALUATION COMPLEXITY: Moderate      PLAN:  OT FREQUENCY: 1-2x/week  OT DURATION: 12 weeks  PLANNED INTERVENTIONS: 97535 self care/ADL training, 54098 therapeutic exercise, 97140 manual therapy, 97039 fluidotherapy, 97010 moist heat, 97034 contrast bath, scar mobilization, passive range of motion, patient/family education, and DME and/or AE instructions     CONSULTED AND AGREED WITH PLAN OF CARE: Patient     Oletta Cohn, OTR/L,CLT 06/14/2023, 6:47 PM        C

## 2023-06-18 ENCOUNTER — Ambulatory Visit: Payer: BC Managed Care – PPO | Admitting: Occupational Therapy

## 2023-06-18 DIAGNOSIS — M25632 Stiffness of left wrist, not elsewhere classified: Secondary | ICD-10-CM | POA: Diagnosis not present

## 2023-06-18 DIAGNOSIS — M6281 Muscle weakness (generalized): Secondary | ICD-10-CM

## 2023-06-18 DIAGNOSIS — M25622 Stiffness of left elbow, not elsewhere classified: Secondary | ICD-10-CM | POA: Diagnosis not present

## 2023-06-18 DIAGNOSIS — L905 Scar conditions and fibrosis of skin: Secondary | ICD-10-CM

## 2023-06-18 DIAGNOSIS — G5632 Lesion of radial nerve, left upper limb: Secondary | ICD-10-CM | POA: Diagnosis not present

## 2023-06-18 DIAGNOSIS — M25612 Stiffness of left shoulder, not elsewhere classified: Secondary | ICD-10-CM

## 2023-06-18 NOTE — Therapy (Signed)
OUTPATIENT OCCUPATIONAL THERAPY ORTHO TREATMENT  Patient Name: Marissa Harrison MRN: [id number] DOB:12-01-1975, 47 y.o., female Today's Date: 06/18/2023  PCP: Darrick Penna NP REFERRING PROVIDER: Dr Carola Frost  END OF SESSION:  OT End of Session - 06/18/23 1739     Visit Number 6    Number of Visits 24    Date for OT Re-Evaluation 08/21/23    OT Start Time 1033    OT Stop Time 1120    OT Time Calculation (min) 47 min    Activity Tolerance Patient tolerated treatment well    Behavior During Therapy [facility] for tasks assessed/performed             Past Medical History:  Diagnosis Date   HSV infection    Past Surgical History:  Procedure Laterality Date   BREAST BIOPSY Right 04/13/2017   BENIGN NODULAR ADENOSIS, wing shaped marker   BREAST BIOPSY Left 11/14/2022   [place] Core Bx Ribbon clip path pending   BREAST BIOPSY Left 11/14/2022   [place] LT BREAST BX W LOC DEV 1ST LESION IMG BX SPEC US GUIDE 11/14/2022 [facility]   BREAST CYST ASPIRATION Right 2007   neg   CESAREAN SECTION  1992   ORIF HUMERUS FRACTURE Left 05/17/2023   Procedure: OPEN REDUCTION INTERNAL FIXATION (ORIF) LEFT HUMERUS FRACTURE;  Surgeon: Myrene Galas, MD;  Location: [facility] OR;  Service: Orthopedics;  Laterality: Left;   TUBAL LIGATION  2003   There are no problems to display for this patient.   ONSET DATE: 05/17/23  REFERRING DIAG: L humerus shaft fx with ORIF and Radial N injury  THERAPY DIAG:  Radial nerve palsy, left  Stiffness of left elbow, not elsewhere classified  Muscle weakness (generalized)  Stiffness of left wrist, not elsewhere classified  Stiffness of left shoulder, not elsewhere classified  Scar condition and fibrosis of skin  Rationale for Evaluation and Treatment: Rehabilitation  SUBJECTIVE:   SUBJECTIVE STATEMENT: I done my exercises -but not the elbow as I should - can you look at my pinkie in the splint - maybe need some velcro like the thumb - the nerve pain is better - with the  nerve medication pt accompanied by: self  PERTINENT HISTORY:  OP NOTE DR HANDY on 05/17/23: PRE-OPERATIVE DIAGNOSIS:   1. LEFT HUMERAL SHAFT FRACTURE 2. RADIAL NERVE INJURY   POST-OPERATIVE DIAGNOSIS:   1. LEFT HUMERAL SHAFT FRACTURE 2. RADIAL NERVE INJURED BUT LARGELY INTACT   PROCEDURE:  Procedure(s): 1. OPEN REDUCTION INTERNAL FIXATION (ORIF) LEFT HUMERAL SHAFT FRACTURE  2. EXPLORATION AND NEUROPLASTY OF RADIAL NERVE      BRIEF SUMMARY AND INDICATIONS FOR PROCEDURE:  [name] is a 47 y.o. who sustained fall resulting in displaced transverse humeral shaft fracture and complete radial nerve deficit.    PRECAUTIONS: No AROM shoulder ABD - not even gravity; AAROM and PROM for wrist , elbow and shoulder flexion; NWB ; Radial N injury     WEIGHT BEARING RESTRICTIONS: No  PAIN:  Are you having pain? Nerve pain posterior upper arm , elbow down to dorsal forearm and hand - 3/10 FALLS: Has patient fallen in last 6 months? Yes. 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  And alsothis nerve injury.  NEXT MD VISIT: middle Dec OBJECTIVE:  Note: Objective measures were completed at Evaluation unless otherwise noted.  HAND DOMINANCE: Right    UPPER EXTREMITY ROM:       Passive ROM Right eval Left eval L 05/31/23 L 06/11/23 L 06/14/23 L 06/18/23  Shoulder flexion  140 supine 120 PROM supine PROM supine 140 AROM 160 160  Shoulder abduction  PROM 90 supine PROM 90 supine PROM supine 90 95 AROM  155  Shoulder adduction        Shoulder extension     55 55  Shoulder internal rotation        Shoulder external rotation        Elbow flexion  130 AAROM 140 AAROM 150 150 150  Elbow extension  -50 coming in - in session -38 AAROM supine after heat -38 coming in - in session -25   -25 coming in - session -15 to -20 -20 to -25 -20 to -23  Wrist flexion  90  80    Wrist extension  On table- without gravity - to neutral with AAROM   AROM without gravity to neutral     Wrist ulnar deviation  On table - 30  3- sliding on paper on table     Wrist radial deviation  On table - 20  20- slding on table on paper    Wrist pronation  90  90    Wrist supination  90  90    (Blank rows = not tested)  Active ROM Right eval Left eval L 06/11/23  Thumb MCP (0-60)     Thumb IP (0-80)     Thumb Radial abd/add (0-55)  Decrease at [facility]   Decrease [facility]   Thumb Palmar abd/add (0-45)  Unable to maintain - drop into flexion   Able to keep in PA 6 reps place and hold- but drop in flexion with ADD or after 6 reps   Thumb Opposition to Small Finger  Unable     Index MCP (0-90)      Index PIP (0-100)      Index DIP (0-70)       Long MCP (0-90)       Long PIP (0-100)       Long DIP (0-70)       Ring MCP (0-90)       Ring PIP (0-100)       Ring DIP (0-70)       Little MCP (0-90)       Little PIP (0-100)       Little DIP (0-70)       Pt can make fist - unable to extend digits- if [facility] support at [facility] - can extend PIP's    UPPER EXTREMITY MMT:     MMT Right eval Left eval  Shoulder flexion    Shoulder abduction    Shoulder adduction    Shoulder extension    Shoulder internal rotation    Shoulder external rotation    Middle trapezius    Lower trapezius    Elbow flexion    Elbow extension    Wrist flexion    Wrist extension    Wrist ulnar deviation    Wrist radial deviation    Wrist pronation    Wrist supination    (Blank rows = not tested)  HAND FUNCTION: NT - Surgery 05/17/23  COORDINATION: Radial N injury - impaired   SENSATION: Numbness and burning pain reported posterior  elbow over dorsal forearm into hand /thumb  EDEMA: improved  greatly  COGNITION: Overall cognitive status: Within functional limits for tasks assessed      TODAY'S TREATMENT:                                                                                                                               DATE: 06/18/23  Patient arrive with radial palsy splint in place.  Observed patient using left grip carrying some light objects  - able to grasp and release light objects Patient reports using it few times a day for 30 minutes. Did assess fit around 5th  - band slip off and not able to assist 5th into extention -apply velcro over top to keep from slipping off - able to extend 5th with changes   Patient continues to report some popping and clicking at the elbow. But appear to be more in elbow extention range that she do not have and gain with stretches   Had appt with DR Carola Frost last week and xray showed good healing -and allowed to do AROM in sitting over head for shoulder including Abd Pt came in today with great progress in shoulder  But elbow extention same  Nerve pain - better with  medication for nerve pain   Scar still tender and adhere  as well as  Tightness over bicep limiting elbow extention   Done moist heat to elbow in supine  for extention stretch  Done extended soft tissue massage to scar this date as well as bicep to increase elbow extension as well as scar adhesion.  Mini massage done over cica scar pad for pt to tolerate  PROM -10 in session Reviewed with patient again scar massage as well as use of Cica -Care scar pad.  prior to active assisted range of motion for elbow flexion extension in 3 positions 5  reps Slight pull keeping it under 1/10 Pt to cont with same AAROM interlocking digits or cane for shoulder flexion  And ABD in supine  Great progress and tolerance  Can also do at work [employer] sliding shoulder flexion on wall  And ABD - doing pendulum  REINFORCE FOR Pt to do at home -heat for elbow stretch at work  And 5 x day at work standing against wall doing elbow ext 3 position 5 reps each  Keep shoulder back   Cont scapula retraction     Supported  forearm on mat  Done PROM wrist extention with gravity  AAROM - place and hold  And AROM with assist to complete  Show 2+/5 for wrist extention          PATIENT EDUCATION: Education details: Findings of evaluation and home program as well as splint wearing Person educated: Patient Education method: Explanation, Demonstration, Tactile cues, Verbal cues, and Handouts Education comprehension: verbalized understanding, returned demonstration, verbal cues required, tactile cues required, and needs further education   GOALS: Goals reviewed with patient? Yes  SHORT TERM GOALS: Target date: 4 wks  Patient to be independent and wearing splints correctly to prevent flexion contractures as well as facilitating functional use of left hand Baseline: Patient arrived with wrist splint on but report not really wearing it.  Patient with some discomfort at wrist as well as tightness with wrist extension.  Patient to wear wrist splint when doing elbow and shoulder exercises.  Info provided to order prefab radial nerve splint. Goal status: INITIAL  2.  Patient to be independent in home program to initiate passive range of motion and active assisted range of motion to left shoulder to prevent stiffness until next appointment with surgeon Baseline: Patient reports she has been doing active range of motion with shoulder overhead ;not wearing a sling or compression and not really wearing any more wrist splint or  sleeping with her splint Goal status: INITIAL NITIAL  LONG TERM GOALS: Target date: 12 wks  Patient to be independent in home program and progression as radial nerve improves to facilitate wrist extension and thumb and digit extension. Baseline: Stiffness in wrist extension.  No wrist extension against gravity.  Partial active assisted range of motion to neutral without gravity on table no digit extension and limited or impaired thumb radial and palmar abduction. Goal status: INITIAL  2.   Left shoulder active range of motion increased to within functional limits to be able to initiate strengthening Baseline: Only passive and active assisted range of motion in supine for shoulder flexion and passive range of motion for shoulder abduction in supine to 90-surgery 05-17-2023 Goal status: INITIAL  3.  Left elbow flexion and extension improve to within functional limits for patient to reach down to feet as well as washing comb hair. Baseline: Elbow extension coming in -50 and during session after heat in supine with active assisted range of motion -38, flexion 130 degrees. Goal status: INITIAL  4.  Upgrade goals as patient is progressing with fracture healing and radial nerve Baseline: Patient only had surgery about 12 days ago. Goal status: INITIAL  5.  Patient to be independent in scar mobilization to increase motion and elbow flexion and extension. Baseline: Steri-Strips still in place on incision over midshaft of humerus Goal status: INITIAL  ASSESSMENT:  CLINICAL IMPRESSION: Patient seen for occupational therapy for left midshaft humerus fracture with ORIF by Dr. Carola Frost patient also has a radial nerve injury from fall.  Pt had appt with DR Carola Frost yesterday -per pt xray looked and showed good healing -can now do AROM for shoulder in sitting - did get medication for nerve pain  Pt show increase AROM for shoulder ABD and flexion -upgrade HEP tolerate well - pt arrive with radial nerve palsy splint in place - able to grasp and release light objects. Pt cont to focus on scar massage and elbow extention - Reinforce to do at work 5 x day extention of elbow - heat , stretch and AROM against wall- show increased second digit [facility] extension and thumb palmar abduction - and Wrist extention done with gravity PROM , AAROM and place and hold - Patient to continue wearing wrist brace in between her radial nerve palsy splint to prevent overstretching of wrist extensors.  Patient limited in functional  use of left upper extremity because of fracture and radial nerve injury patient with decreased range of motion and strength as well as nerve injury.  Patient can benefit from skilled OT services to increase motion increase strength to return to prior level of function.  PERFORMANCE DEFICITS: in functional skills including ADLs,  IADLs, coordination, sensation, edema, ROM, strength, pain, flexibility, decreased knowledge of precautions, decreased knowledge of use of DME, and UE functional use,     IMPAIRMENTS: are limiting patient from ADLs, IADLs, rest and sleep, work, play, leisure, and social participation.   COMORBIDITIES: has no other co-morbidities that affects occupational performance. Patient will benefit from skilled OT to address above impairments and improve overall function.  MODIFICATION OR ASSISTANCE TO COMPLETE EVALUATION: Min-Moderate modification of tasks or assist with assess necessary to complete an evaluation.  OT OCCUPATIONAL PROFILE AND HISTORY: Detailed assessment: Review of records and additional review of physical, cognitive, psychosocial history related to current functional performance.  CLINICAL DECISION MAKING: Moderate - several treatment options, min-mod task modification necessary  REHAB POTENTIAL: Good  EVALUATION COMPLEXITY: Moderate      PLAN:  OT FREQUENCY: 1-2x/week  OT DURATION: 12 weeks  PLANNED INTERVENTIONS: 97535 self care/ADL training, 84696 therapeutic exercise, 97140 manual therapy, 97039 fluidotherapy, 97010 moist heat, 97034 contrast bath, scar mobilization, passive range of motion, patient/family education, and DME and/or AE instructions     CONSULTED AND AGREED WITH PLAN OF CARE: Patient     Oletta Cohn, OTR/L,CLT 06/18/2023, 5:45 PM        C

## 2023-06-21 ENCOUNTER — Ambulatory Visit: Payer: BC Managed Care – PPO | Admitting: Occupational Therapy

## 2023-06-21 DIAGNOSIS — M25622 Stiffness of left elbow, not elsewhere classified: Secondary | ICD-10-CM | POA: Diagnosis not present

## 2023-06-21 DIAGNOSIS — M6281 Muscle weakness (generalized): Secondary | ICD-10-CM | POA: Diagnosis not present

## 2023-06-21 DIAGNOSIS — M25612 Stiffness of left shoulder, not elsewhere classified: Secondary | ICD-10-CM

## 2023-06-21 DIAGNOSIS — G5632 Lesion of radial nerve, left upper limb: Secondary | ICD-10-CM

## 2023-06-21 DIAGNOSIS — M25632 Stiffness of left wrist, not elsewhere classified: Secondary | ICD-10-CM

## 2023-06-21 DIAGNOSIS — L905 Scar conditions and fibrosis of skin: Secondary | ICD-10-CM | POA: Diagnosis not present

## 2023-06-21 NOTE — Therapy (Signed)
OUTPATIENT OCCUPATIONAL THERAPY ORTHO TREATMENT  Patient Name: Marissa Harrison MRN: [id number] DOB:10/04/1975, 47 y.o., female Today's Date: 06/21/2023  PCP: Darrick Penna NP REFERRING PROVIDER: Dr Carola Frost  END OF SESSION:  OT End of Session - 06/21/23 1857     Visit Number 7    Number of Visits 24    Date for OT Re-Evaluation 08/21/23    OT Start Time 1535    OT Stop Time 1610    OT Time Calculation (min) 35 min    Activity Tolerance Patient tolerated treatment well    Behavior During Therapy [facility] for tasks assessed/performed             Past Medical History:  Diagnosis Date   HSV infection    Past Surgical History:  Procedure Laterality Date   BREAST BIOPSY Right 04/13/2017   BENIGN NODULAR ADENOSIS, wing shaped marker   BREAST BIOPSY Left 11/14/2022   [place] Core Bx Ribbon clip path pending   BREAST BIOPSY Left 11/14/2022   [place] LT BREAST BX W LOC DEV 1ST LESION IMG BX SPEC US GUIDE 11/14/2022 [facility]   BREAST CYST ASPIRATION Right 2007   neg   CESAREAN SECTION  1992   ORIF HUMERUS FRACTURE Left 05/17/2023   Procedure: OPEN REDUCTION INTERNAL FIXATION (ORIF) LEFT HUMERUS FRACTURE;  Surgeon: Myrene Galas, MD;  Location: [facility] OR;  Service: Orthopedics;  Laterality: Left;   TUBAL LIGATION  2003   There are no problems to display for this patient.   ONSET DATE: 05/17/23  REFERRING DIAG: L humerus shaft fx with ORIF and Radial N injury  THERAPY DIAG:  Radial nerve palsy, left  Stiffness of left elbow, not elsewhere classified  Muscle weakness (generalized)  Stiffness of left wrist, not elsewhere classified  Stiffness of left shoulder, not elsewhere classified  Scar condition and fibrosis of skin  Rationale for Evaluation and Treatment: Rehabilitation  SUBJECTIVE:   SUBJECTIVE STATEMENT: I done my elbow exercises more.  I think is better.  And then I am excited I think my thumb is better, able to hold it up better-did not do the shoulder exercises. The nerve  pain is better -did not take my nerve medication yet today  PERTINENT HISTORY:  OP NOTE DR HANDY on 05/17/23: PRE-OPERATIVE DIAGNOSIS:   1. LEFT HUMERAL SHAFT FRACTURE 2. RADIAL NERVE INJURY   POST-OPERATIVE DIAGNOSIS:   1. LEFT HUMERAL SHAFT FRACTURE 2. RADIAL NERVE INJURED BUT LARGELY INTACT   PROCEDURE:  Procedure(s): 1. OPEN REDUCTION INTERNAL FIXATION (ORIF) LEFT HUMERAL SHAFT FRACTURE  2. EXPLORATION AND NEUROPLASTY OF RADIAL NERVE      BRIEF SUMMARY AND INDICATIONS FOR PROCEDURE:  Marissa Harrison is a 47 y.o. who sustained fall resulting in displaced transverse humeral shaft fracture and complete radial nerve deficit.    PRECAUTIONS: No AROM shoulder ABD - not even gravity; AAROM and PROM for wrist , elbow and shoulder flexion; NWB ; Radial N injury     WEIGHT BEARING RESTRICTIONS: No  PAIN:  Are you having pain? Nerve pain posterior upper arm , elbow down to dorsal forearm and hand - 3/10 FALLS: Has patient fallen in last 6 months? Yes. 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  And alsothis nerve injury.  NEXT MD VISIT: middle  Dec OBJECTIVE:  Note: Objective measures were completed at Evaluation unless otherwise noted.  HAND DOMINANCE: Right    UPPER EXTREMITY ROM:       Passive ROM Right eval Left eval L 05/31/23 L 06/11/23 L 06/14/23 L 06/18/23 L 06/21/23  Shoulder flexion  140 supine 120 PROM supine PROM supine 140 AROM 160 160   Shoulder abduction  PROM 90 supine PROM 90 supine PROM supine 90 95 AROM  155   Shoulder adduction         Shoulder extension     55 55   Shoulder internal rotation         Shoulder external rotation         Elbow flexion  130 AAROM 140 AAROM 150 150 150   Elbow extension  -50 coming in - in session -38 AAROM supine after  heat -38 coming in - in session -25  -25 coming in - session -15 to -20 -20 to -25 -20 to -23 -15 to -18   Wrist flexion  90  80     Wrist extension  On table- without gravity - to neutral with AAROM   AROM without gravity to neutral      Wrist ulnar deviation  On table - 30  3- sliding on paper on table      Wrist radial deviation  On table - 20  20- slding on table on paper     Wrist pronation  90  90     Wrist supination  90  90     (Blank rows = not tested)  Active ROM Right eval Left eval L 06/11/23 L 06/21/23  Thumb MCP (0-60)      Thumb IP (0-80)      Thumb Radial abd/add (0-55)  Decrease at [facility]   Decrease [facility]    Thumb Palmar abd/add (0-45)  Unable to maintain - drop into flexion   Able to keep in PA 6 reps place and hold- but drop in flexion with ADD or after 6 reps  Able to maintain palmar abduction position for 10-12 reps.  Able to initiate rubber band for resistance.  Thumb Opposition to Small Finger  Unable      Index MCP (0-90)       Index PIP (0-100)       Index DIP (0-70)        Long MCP (0-90)        Long PIP (0-100)        Long DIP (0-70)        Ring MCP (0-90)        Ring PIP (0-100)        Ring DIP (0-70)        Little MCP (0-90)        Little PIP (0-100)        Little DIP (0-70)        Pt can make fist - unable to extend digits- if [facility] support at [facility] - can extend PIP's    UPPER EXTREMITY MMT:     MMT Right eval Left eval  Shoulder flexion    Shoulder abduction    Shoulder adduction    Shoulder extension    Shoulder internal rotation    Shoulder external rotation    Middle trapezius    Lower trapezius    Elbow flexion    Elbow extension    Wrist flexion    Wrist extension    Wrist ulnar deviation  Wrist radial deviation    Wrist pronation    Wrist supination    (Blank rows = not tested)  HAND FUNCTION: NT - Surgery 05/17/23  COORDINATION: Radial N injury - impaired   SENSATION: Numbness and burning pain reported posterior  elbow over  dorsal forearm into hand /thumb  EDEMA: improved greatly  COGNITION: Overall cognitive status: Within functional limits for tasks assessed      TODAY'S TREATMENT:                                                                                                                              DATE: 06/21/23  Patient arrive with radial palsy splint in place.  Observed patient using left grip carrying some light objects  - able to grasp and release light objects Patient reports using it few times a day for 30 minutes.   Patient report less popping and clicking at the elbow.  Pt with increase elbow extention today - doing HEP at work now    appt with DR Carola Frost 2 weeks ago and xray showed good healing -and allowed to do AROM in sitting over head for shoulder including Abd Pt came in last week with great progress in shoulder but appear patient did not do shoulder exercises since last time.  With a little bit of increase stiffness. Reminded her again to continue with active assisted range of motion for shoulder flexion and abduction on wall.  To do 2-3 times a day at work.  Nerve pain - better with  medication for nerve pain   Scar still tender and adhere  -but improving Tightness over bicep limiting elbow extention but improving  Done moist heat to elbow in supine  for extention stretch  Done soft tissue massage to scar this date as well as bicep to increase elbow extension as well as scar adhesion.  Mini massage done over cica scar pad for pt to tolerate  PROM - 5 to 10 in session Reviewed with patient again scar massage as well as use of Cica -Care scar pad.  prior to active assisted range of motion for elbow flexion extension in 3 positions 5  reps Slight pull keeping it under 1/10   REINFORCE FOR Pt to do at home -heat for elbow stretch at work  And 5 x day at work standing against wall doing elbow ext 3 position 5 reps each  Keep shoulder back   Cont scapula retraction multiple  times during the day    Supported forearm on mat  Done PROM wrist extention with gravity  AAROM - place and hold  And AROM with assist to complete  Show 2+/5 for wrist extention   Thumb palmar abduction able to maintain in position doing 10 reps. Able to add rubber band for palmar abduction.  Patient to focus to keeping it to the height of second digit.         PATIENT EDUCATION: Education details: Findings of  evaluation and home program as well as splint wearing Person educated: Patient Education method: Explanation, Demonstration, Tactile cues, Verbal cues, and Handouts Education comprehension: verbalized understanding, returned demonstration, verbal cues required, tactile cues required, and needs further education   GOALS: Goals reviewed with patient? Yes  SHORT TERM GOALS: Target date: 4 wks   Patient to be independent and wearing splints correctly to prevent flexion contractures as well as facilitating functional use of left hand Baseline: Patient arrived with wrist splint on but report not really wearing it.  Patient with some discomfort at wrist as well as tightness with wrist extension.  Patient to wear wrist splint when doing elbow and shoulder exercises.  Info provided to order prefab radial nerve splint. Goal status: INITIAL  2.  Patient to be independent in home program to initiate passive range of motion and active assisted range of motion to left shoulder to prevent stiffness until next appointment with surgeon Baseline: Patient reports she has been doing active range of motion with shoulder overhead ;not wearing a sling or compression and not really wearing any more wrist splint or  sleeping with her splint Goal status: INITIAL NITIAL  LONG TERM GOALS: Target date: 12 wks  Patient to be independent in home program and progression as radial nerve improves to facilitate wrist extension and thumb and digit extension. Baseline: Stiffness in wrist extension.  No  wrist extension against gravity.  Partial active assisted range of motion to neutral without gravity on table no digit extension and limited or impaired thumb radial and palmar abduction. Goal status: INITIAL  2.  Left shoulder active range of motion increased to within functional limits to be able to initiate strengthening Baseline: Only passive and active assisted range of motion in supine for shoulder flexion and passive range of motion for shoulder abduction in supine to 90-surgery 05-17-2023 Goal status: INITIAL  3.  Left elbow flexion and extension improve to within functional limits for patient to reach down to feet as well as washing comb hair. Baseline: Elbow extension coming in -50 and during session after heat in supine with active assisted range of motion -38, flexion 130 degrees. Goal status: INITIAL  4.  Upgrade goals as patient is progressing with fracture healing and radial nerve Baseline: Patient only had surgery about 12 days ago. Goal status: INITIAL  5.  Patient to be independent in scar mobilization to increase motion and elbow flexion and extension. Baseline: Steri-Strips still in place on incision over midshaft of humerus Goal status: INITIAL  ASSESSMENT:  CLINICAL IMPRESSION: Patient seen for occupational therapy for left midshaft humerus fracture with ORIF by Dr. Carola Frost patient also has a radial nerve injury from fall.  Pt had appt with DR Carola Frost yesterday -per pt xray looked and showed good healing -can now do AROM for shoulder in sitting - did get medication for nerve pain  Pt show increase AROM for shoulder ABD and flexion as well as elbow extension this date-reporting doing it more at work since reinforced last visit.  Remind patient again to continue with shoulder exercises to.  Patient pt arrive with radial nerve palsy splint in place - able to grasp and release light objects. Pt cont to focus on scar massage and elbow extention - Reinforce to do at work 5 x day  extention of elbow - heat , stretch and AROM against wall- show increased second digit [facility] extension and thumb palmar abduction this date able to maintain height and adding rubber band.  Wrist extention  done with gravity PROM , AAROM and place and hold - Patient to continue wearing wrist brace in between her radial nerve palsy splint to prevent overstretching of wrist extensors.  Patient limited in functional use of left upper extremity because of fracture and radial nerve injury patient with decreased range of motion and strength as well as nerve injury.  Patient can benefit from skilled OT services to increase motion increase strength to return to prior level of function.  PERFORMANCE DEFICITS: in functional skills including ADLs, IADLs, coordination, sensation, edema, ROM, strength, pain, flexibility, decreased knowledge of precautions, decreased knowledge of use of DME, and UE functional use,     IMPAIRMENTS: are limiting patient from ADLs, IADLs, rest and sleep, work, play, leisure, and social participation.   COMORBIDITIES: has no other co-morbidities that affects occupational performance. Patient will benefit from skilled OT to address above impairments and improve overall function.  MODIFICATION OR ASSISTANCE TO COMPLETE EVALUATION: Min-Moderate modification of tasks or assist with assess necessary to complete an evaluation.  OT OCCUPATIONAL PROFILE AND HISTORY: Detailed assessment: Review of records and additional review of physical, cognitive, psychosocial history related to current functional performance.  CLINICAL DECISION MAKING: Moderate - several treatment options, min-mod task modification necessary  REHAB POTENTIAL: Good  EVALUATION COMPLEXITY: Moderate      PLAN:  OT FREQUENCY: 1-2x/week  OT DURATION: 12 weeks  PLANNED INTERVENTIONS: 97535 self care/ADL training, 54098 therapeutic exercise, 97140 manual therapy, 97039 fluidotherapy, 97010 moist heat, 97034 contrast bath,  scar mobilization, passive range of motion, patient/family education, and DME and/or AE instructions     CONSULTED AND AGREED WITH PLAN OF CARE: Patient     Oletta Cohn, OTR/L,CLT 06/21/2023, 6:59 PM        C

## 2023-06-25 ENCOUNTER — Ambulatory Visit: Payer: BC Managed Care – PPO | Admitting: Occupational Therapy

## 2023-06-25 DIAGNOSIS — M25622 Stiffness of left elbow, not elsewhere classified: Secondary | ICD-10-CM

## 2023-06-25 DIAGNOSIS — M25612 Stiffness of left shoulder, not elsewhere classified: Secondary | ICD-10-CM | POA: Diagnosis not present

## 2023-06-25 DIAGNOSIS — M6281 Muscle weakness (generalized): Secondary | ICD-10-CM | POA: Diagnosis not present

## 2023-06-25 DIAGNOSIS — L905 Scar conditions and fibrosis of skin: Secondary | ICD-10-CM

## 2023-06-25 DIAGNOSIS — M25632 Stiffness of left wrist, not elsewhere classified: Secondary | ICD-10-CM

## 2023-06-25 DIAGNOSIS — G5632 Lesion of radial nerve, left upper limb: Secondary | ICD-10-CM

## 2023-06-25 NOTE — Therapy (Signed)
OUTPATIENT OCCUPATIONAL THERAPY ORTHO TREATMENT  Patient Name: Marissa Harrison MRN: [id number] DOB:09-Mar-1976, 47 y.o., female Today's Date: 06/25/2023  PCP: Darrick Penna NP REFERRING PROVIDER: Dr Marissa Harrison  END OF SESSION:  OT End of Session - 06/25/23 1632     Visit Number 8    Number of Visits 24    Date for OT Re-Evaluation 08/21/23    OT Start Time 1430    OT Stop Time 1526    OT Time Calculation (min) 56 min    Activity Tolerance Patient tolerated treatment well    Behavior During Therapy [facility] for tasks assessed/performed             Past Medical History:  Diagnosis Date   HSV infection    Past Surgical History:  Procedure Laterality Date   BREAST BIOPSY Right 04/13/2017   BENIGN NODULAR ADENOSIS, wing shaped marker   BREAST BIOPSY Left 11/14/2022   [place] Core Bx Ribbon clip path pending   BREAST BIOPSY Left 11/14/2022   [place] LT BREAST BX W LOC DEV 1ST LESION IMG BX SPEC US GUIDE 11/14/2022 [facility]   BREAST CYST ASPIRATION Right 2007   neg   CESAREAN SECTION  1992   ORIF HUMERUS FRACTURE Left 05/17/2023   Procedure: OPEN REDUCTION INTERNAL FIXATION (ORIF) LEFT HUMERUS FRACTURE;  Surgeon: Marissa Galas, MD;  Location: [facility] OR;  Service: Orthopedics;  Laterality: Left;   TUBAL LIGATION  2003   There are no problems to display for this patient.   ONSET DATE: 05/17/23  REFERRING DIAG: L humerus shaft fx with ORIF and Radial N injury  THERAPY DIAG:  Radial nerve palsy, left  Stiffness of left elbow, not elsewhere classified  Muscle weakness (generalized)  Stiffness of left wrist, not elsewhere classified  Stiffness of left shoulder, not elsewhere classified  Scar condition and fibrosis of skin  Rationale for Evaluation and Treatment: Rehabilitation  SUBJECTIVE:   SUBJECTIVE STATEMENT: I had a busy weekend I did not do my exercises I should have.  I feel my elbow still same than last time.  I lost my thumb strep.  To be honest I do not wear my brace a  lot. PERTINENT HISTORY:  OP NOTE Marissa Harrison on 05/17/23: PRE-OPERATIVE DIAGNOSIS:   1. LEFT HUMERAL SHAFT FRACTURE 2. RADIAL NERVE INJURY   POST-OPERATIVE DIAGNOSIS:   1. LEFT HUMERAL SHAFT FRACTURE 2. RADIAL NERVE INJURED BUT LARGELY INTACT   PROCEDURE:  Procedure(s): 1. OPEN REDUCTION INTERNAL FIXATION (ORIF) LEFT HUMERAL SHAFT FRACTURE  2. EXPLORATION AND NEUROPLASTY OF RADIAL NERVE      BRIEF SUMMARY AND INDICATIONS FOR PROCEDURE:  OCTAYVIA ADDIE is a 47 y.o. who sustained fall resulting in displaced transverse humeral shaft fracture and complete radial nerve deficit.    PRECAUTIONS: No AROM shoulder ABD - not even gravity; AAROM and PROM for wrist , elbow and shoulder flexion; NWB ; Radial N injury     WEIGHT BEARING RESTRICTIONS: No  PAIN:  Are you having pain? Nerve pain posterior upper arm , elbow down to dorsal forearm and hand - 3/10 FALLS: Has patient fallen in last 6 months? Yes. 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  And alsothis nerve injury.  NEXT MD VISIT: middle Dec OBJECTIVE:  Note: Objective measures  were completed at Evaluation unless otherwise noted.  HAND DOMINANCE: Right    UPPER EXTREMITY ROM:       Passive ROM Right eval Left eval L 05/31/23 L 06/11/23 L 06/14/23 L 06/18/23 L 06/21/23  Shoulder flexion  140 supine 120 PROM supine PROM supine 140 AROM 160 160   Shoulder abduction  PROM 90 supine PROM 90 supine PROM supine 90 95 AROM  155   Shoulder adduction         Shoulder extension     55 55   Shoulder internal rotation         Shoulder external rotation         Elbow flexion  130 AAROM 140 AAROM 150 150 150   Elbow extension  -50 coming in - in session -38 AAROM supine after heat -38 coming in - in session -25  -25 coming in -  session -15 to -20 -20 to -25 -20 to -23 -15 to -18   Wrist flexion  90  80     Wrist extension  On table- without gravity - to neutral with AAROM   AROM without gravity to neutral      Wrist ulnar deviation  On table - 30  3- sliding on paper on table      Wrist radial deviation  On table - 20  20- slding on table on paper     Wrist pronation  90  90     Wrist supination  90  90     (Blank rows = not tested)  Active ROM Right eval Left eval L 06/11/23 L 06/21/23  Thumb MCP (0-60)      Thumb IP (0-80)      Thumb Radial abd/add (0-55)  Decrease at [facility]   Decrease [facility]    Thumb Palmar abd/add (0-45)  Unable to maintain - drop into flexion   Able to keep in PA 6 reps place and hold- but drop in flexion with ADD or after 6 reps  Able to maintain palmar abduction position for 10-12 reps.  Able to initiate rubber band for resistance.  Thumb Opposition to Small Finger  Unable      Index MCP (0-90)       Index PIP (0-100)       Index DIP (0-70)        Long MCP (0-90)        Long PIP (0-100)        Long DIP (0-70)        Ring MCP (0-90)        Ring PIP (0-100)        Ring DIP (0-70)        Little MCP (0-90)        Little PIP (0-100)        Little DIP (0-70)        Pt can make fist - unable to extend digits- if [facility] support at [facility] - can extend PIP's    UPPER EXTREMITY MMT:     MMT Right eval Left eval  Shoulder flexion    Shoulder abduction    Shoulder adduction    Shoulder extension    Shoulder internal rotation    Shoulder external rotation    Middle trapezius    Lower trapezius    Elbow flexion    Elbow extension    Wrist flexion    Wrist extension    Wrist ulnar deviation    Wrist radial deviation  Wrist pronation    Wrist supination    (Blank rows = not tested)  HAND FUNCTION: NT - Surgery 05/17/23  COORDINATION: Radial N injury - impaired   SENSATION: Numbness and burning pain reported posterior  elbow over dorsal forearm into hand /thumb  EDEMA: improved  greatly  COGNITION: Overall cognitive status: Within functional limits for tasks assessed      TODAY'S TREATMENT:                                                                                                                              DATE: 11/25 using it/24  Patient arrive with radial palsy splint in place.  Observed patient using left grip carrying some light objects  - able to grasp and release light objects Patient reports using it few times a day for 30 minutes.  Patient admitted not wearing it for all the other brace during the day.   Patient report less popping and clicking at the elbow.  Patient maintained elbow extension from last time.  Reinforced with patient to do at home to progress  appt with Marissa Marissa Harrison 2 weeks ago and xray showed good healing -and allowed to do AROM in sitting over head for shoulder including Abd Pt came in last week with great progress in shoulder but appear patient did not do shoulder exercises since last time.  With a little bit of increase stiffness. Review with patient active assisted range of motion for shoulder flexion and abduction on wall.  12 reps pain-free-patient to do 2-3 times a day at work.  Nerve pain - better with  medication for nerve pain   Scar still tender and adhere  -but improving Tightness over bicep limiting elbow extention but improving  Done moist heat to elbow in supine  for extention stretch  Done soft tissue massage to scar  as well as bicep to increase elbow extension as well as scar adhesion.  Mini massage done over cica scar pad for pt to tolerate  Use extractor with elbow extension few times without increased pain PROM and active range of motion elbow extension to -5 in session Reviewed with patient again scar massage as well as use of Cica -Care scar pad.  prior to active assisted range of motion for elbow flexion extension in 3 positions 10 reps reps Slight pull keeping it under 1/10   REINFORCE FOR Pt to do at  home -heat for elbow stretch at work  And 5 x day at work standing against wall doing elbow ext 3 position 10 reps each  Keep shoulder back   Cont scapula retraction multiple times during the day  Reinforced with patient importance of wearing a wrist brace.  Over edge of table down place and hold the digit extension. Patient able to maintain -60-70 [facility] extension.  Fatigue Encouraged also active assisted range of motion for elbow extension without gravity patient finger to neutral. Reinforce and done placed on hold for  wrist extension against gravity.  Thumb palmar abduction able to maintain in position doing 10 reps. Able to add rubber band for palmar abduction.  Patient to focus to keeping it to the height of second digit.         PATIENT EDUCATION: Education details: Findings of evaluation and home program as well as splint wearing Person educated: Patient Education method: Explanation, Demonstration, Tactile cues, Verbal cues, and Handouts Education comprehension: verbalized understanding, returned demonstration, verbal cues required, tactile cues required, and needs further education   GOALS: Goals reviewed with patient? Yes  SHORT TERM GOALS: Target date: 4 wks   Patient to be independent and wearing splints correctly to prevent flexion contractures as well as facilitating functional use of left hand Baseline: Patient arrived with wrist splint on but report not really wearing it.  Patient with some discomfort at wrist as well as tightness with wrist extension.  Patient to wear wrist splint when doing elbow and shoulder exercises.  Info provided to order prefab radial nerve splint. Goal status: INITIAL  2.  Patient to be independent in home program to initiate passive range of motion and active assisted range of motion to left shoulder to prevent stiffness until next appointment with surgeon Baseline: Patient reports she has been doing active range of motion with shoulder  overhead ;not wearing a sling or compression and not really wearing any more wrist splint or  sleeping with her splint Goal status: INITIAL NITIAL  LONG TERM GOALS: Target date: 12 wks  Patient to be independent in home program and progression as radial nerve improves to facilitate wrist extension and thumb and digit extension. Baseline: Stiffness in wrist extension.  No wrist extension against gravity.  Partial active assisted range of motion to neutral without gravity on table no digit extension and limited or impaired thumb radial and palmar abduction. Goal status: INITIAL  2.  Left shoulder active range of motion increased to within functional limits to be able to initiate strengthening Baseline: Only passive and active assisted range of motion in supine for shoulder flexion and passive range of motion for shoulder abduction in supine to 90-surgery 05-17-2023 Goal status: INITIAL  3.  Left elbow flexion and extension improve to within functional limits for patient to reach down to feet as well as washing comb hair. Baseline: Elbow extension coming in -50 and during session after heat in supine with active assisted range of motion -38, flexion 130 degrees. Goal status: INITIAL  4.  Upgrade goals as patient is progressing with fracture healing and radial nerve Baseline: Patient only had surgery about 12 days ago. Goal status: INITIAL  5.  Patient to be independent in scar mobilization to increase motion and elbow flexion and extension. Baseline: Steri-Strips still in place on incision over midshaft of humerus Goal status: INITIAL  ASSESSMENT:  CLINICAL IMPRESSION: Patient seen for occupational therapy for left midshaft humerus fracture with ORIF by Marissa. Carola Harrison patient also has a radial nerve injury from fall.  Pt had appt with Marissa Marissa Harrison yesterday -per pt xray looked and showed good healing -can now do AROM for shoulder in sitting - did get medication for nerve pain  Pt AROM for shoulder  ABD and flexion  WFL - and elbow extension this date about same- Reinforce  patient again to continue with shoulder exercises  and elbow extention stretches and AROM .  Patient  also to wear wrist splint to prevent over stretching of extensors of forearm.  Pt cont to focus on  scar massage and elbow extention - Reinforce to do at work 5 x day extention of elbow - heat , stretch and AROM against wall- s reinforced with patient to work on active assisted range of motion in place and hold for wrist extension as well as digit extension over the weekend.  Patient limited in functional use of left upper extremity because of fracture and radial nerve injury patient with decreased range of motion and strength as well as nerve injury.  Patient can benefit from skilled OT services to increase motion increase strength to return to prior level of function.  PERFORMANCE DEFICITS: in functional skills including ADLs, IADLs, coordination, sensation, edema, ROM, strength, pain, flexibility, decreased knowledge of precautions, decreased knowledge of use of DME, and UE functional use,     IMPAIRMENTS: are limiting patient from ADLs, IADLs, rest and sleep, work, play, leisure, and social participation.   COMORBIDITIES: has no other co-morbidities that affects occupational performance. Patient will benefit from skilled OT to address above impairments and improve overall function.  MODIFICATION OR ASSISTANCE TO COMPLETE EVALUATION: Min-Moderate modification of tasks or assist with assess necessary to complete an evaluation.  OT OCCUPATIONAL PROFILE AND HISTORY: Detailed assessment: Review of records and additional review of physical, cognitive, psychosocial history related to current functional performance.  CLINICAL DECISION MAKING: Moderate - several treatment options, min-mod task modification necessary  REHAB POTENTIAL: Good  EVALUATION COMPLEXITY: Moderate      PLAN:  OT FREQUENCY: 1-2x/week  OT DURATION:  12 weeks  PLANNED INTERVENTIONS: 97535 self care/ADL training, 78295 therapeutic exercise, 97140 manual therapy, 97039 fluidotherapy, 97010 moist heat, 97034 contrast bath, scar mobilization, passive range of motion, patient/family education, and DME and/or AE instructions     CONSULTED AND AGREED WITH PLAN OF CARE: Patient     Oletta Cohn, OTR/L,CLT 06/25/2023, 4:34 PM        C

## 2023-06-26 ENCOUNTER — Ambulatory Visit: Payer: BC Managed Care – PPO | Admitting: Occupational Therapy

## 2023-06-26 DIAGNOSIS — G5632 Lesion of radial nerve, left upper limb: Secondary | ICD-10-CM | POA: Diagnosis not present

## 2023-06-26 DIAGNOSIS — M25622 Stiffness of left elbow, not elsewhere classified: Secondary | ICD-10-CM | POA: Diagnosis not present

## 2023-06-26 DIAGNOSIS — L905 Scar conditions and fibrosis of skin: Secondary | ICD-10-CM

## 2023-06-26 DIAGNOSIS — M25632 Stiffness of left wrist, not elsewhere classified: Secondary | ICD-10-CM

## 2023-06-26 DIAGNOSIS — M25612 Stiffness of left shoulder, not elsewhere classified: Secondary | ICD-10-CM | POA: Diagnosis not present

## 2023-06-26 DIAGNOSIS — M6281 Muscle weakness (generalized): Secondary | ICD-10-CM

## 2023-06-26 NOTE — Therapy (Signed)
OUTPATIENT OCCUPATIONAL THERAPY ORTHO TREATMENT  Patient Name: Marissa Harrison MRN: [id number] DOB:05/19/1976, 47 y.o., female Today's Date: 06/26/2023  PCP: Darrick Penna NP REFERRING PROVIDER: Dr Carola Frost  END OF SESSION:  OT End of Session - 06/26/23 1444     Visit Number 9    Number of Visits 24    Date for OT Re-Evaluation 08/21/23    OT Start Time 1444    OT Stop Time 1524    OT Time Calculation (min) 40 min    Activity Tolerance Patient tolerated treatment well    Behavior During Therapy [facility] for tasks assessed/performed             Past Medical History:  Diagnosis Date   HSV infection    Past Surgical History:  Procedure Laterality Date   BREAST BIOPSY Right 04/13/2017   BENIGN NODULAR ADENOSIS, wing shaped marker   BREAST BIOPSY Left 11/14/2022   [place] Core Bx Ribbon clip path pending   BREAST BIOPSY Left 11/14/2022   [place] LT BREAST BX W LOC DEV 1ST LESION IMG BX SPEC US GUIDE 11/14/2022 [facility]   BREAST CYST ASPIRATION Right 2007   neg   CESAREAN SECTION  1992   ORIF HUMERUS FRACTURE Left 05/17/2023   Procedure: OPEN REDUCTION INTERNAL FIXATION (ORIF) LEFT HUMERUS FRACTURE;  Surgeon: Myrene Galas, MD;  Location: [facility] OR;  Service: Orthopedics;  Laterality: Left;   TUBAL LIGATION  2003   There are no problems to display for this patient.   ONSET DATE: 05/17/23  REFERRING DIAG: L humerus shaft fx with ORIF and Radial N injury  THERAPY DIAG:  Radial nerve palsy, left  Stiffness of left elbow, not elsewhere classified  Muscle weakness (generalized)  Stiffness of left wrist, not elsewhere classified  Stiffness of left shoulder, not elsewhere classified  Scar condition and fibrosis of skin  Rationale for Evaluation and Treatment: Rehabilitation  SUBJECTIVE:   SUBJECTIVE STATEMENT: I did my elbow exercises like you told me -  I did try and wear my  splint more   PERTINENT HISTORY:  OP NOTE DR HANDY on 05/17/23: PRE-OPERATIVE DIAGNOSIS:   1. LEFT  HUMERAL SHAFT FRACTURE 2. RADIAL NERVE INJURY   POST-OPERATIVE DIAGNOSIS:   1. LEFT HUMERAL SHAFT FRACTURE 2. RADIAL NERVE INJURED BUT LARGELY INTACT   PROCEDURE:  Procedure(s): 1. OPEN REDUCTION INTERNAL FIXATION (ORIF) LEFT HUMERAL SHAFT FRACTURE  2. EXPLORATION AND NEUROPLASTY OF RADIAL NERVE      BRIEF SUMMARY AND INDICATIONS FOR PROCEDURE:  QUENTELLA MCGUFFIE is a 47 y.o. who sustained fall resulting in displaced transverse humeral shaft fracture and complete radial nerve deficit.    PRECAUTIONS: No AROM shoulder ABD - not even gravity; AAROM and PROM for wrist , elbow and shoulder flexion; NWB ; Radial N injury     WEIGHT BEARING RESTRICTIONS: No  PAIN:  Are you having pain? Nerve pain posterior upper arm , elbow down to dorsal forearm and hand - 3/10 FALLS: Has patient fallen in last 6 months? Yes. 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  And alsothis nerve injury.  NEXT MD VISIT: middle Dec OBJECTIVE:  Note: Objective measures were completed at Evaluation unless otherwise noted.  HAND DOMINANCE: Right    UPPER EXTREMITY ROM:  Passive ROM Right eval Left eval L 05/31/23 L 06/11/23 L 06/14/23 L 06/18/23 L 06/21/23 L 06/26/23  Shoulder flexion  140 supine 120 PROM supine PROM supine 140 AROM 160 160    Shoulder abduction  PROM 90 supine PROM 90 supine PROM supine 90 95 AROM  155    Shoulder adduction          Shoulder extension     55 55    Shoulder internal rotation          Shoulder external rotation          Elbow flexion  130 AAROM 140 AAROM 150 150 150    Elbow extension  -50 coming in - in session -38 AAROM supine after heat -38 coming in - in session -25  -25 coming in - session -15 to -20 -20 to -25 -20 to -23 -15 to -18  -5 to -15  Wrist  flexion  90  80      Wrist extension  On table- without gravity - to neutral with AAROM   AROM without gravity to neutral       Wrist ulnar deviation  On table - 30  3- sliding on paper on table       Wrist radial deviation  On table - 20  20- slding on table on paper      Wrist pronation  90  90      Wrist supination  90  90      (Blank rows = not tested)  Active ROM Right eval Left eval L 06/11/23 L 06/21/23  Thumb MCP (0-60)      Thumb IP (0-80)      Thumb Radial abd/add (0-55)  Decrease at [facility]   Decrease [facility]    Thumb Palmar abd/add (0-45)  Unable to maintain - drop into flexion   Able to keep in PA 6 reps place and hold- but drop in flexion with ADD or after 6 reps  Able to maintain palmar abduction position for 10-12 reps.  Able to initiate rubber band for resistance.  Thumb Opposition to Small Finger  Unable      Index MCP (0-90)       Index PIP (0-100)       Index DIP (0-70)        Long MCP (0-90)        Long PIP (0-100)        Long DIP (0-70)        Ring MCP (0-90)        Ring PIP (0-100)        Ring DIP (0-70)        Little MCP (0-90)        Little PIP (0-100)        Little DIP (0-70)        Pt can make fist - unable to extend digits- if [facility] support at [facility] - can extend PIP's    UPPER EXTREMITY MMT:     MMT Right eval Left eval  Shoulder flexion    Shoulder abduction    Shoulder adduction    Shoulder extension    Shoulder internal rotation    Shoulder external rotation    Middle trapezius    Lower trapezius    Elbow flexion    Elbow extension    Wrist flexion    Wrist extension    Wrist ulnar deviation    Wrist radial deviation    Wrist pronation  Wrist supination    (Blank rows = not tested)  HAND FUNCTION: NT - Surgery 05/17/23  COORDINATION: Radial N injury - impaired   SENSATION: Numbness and burning pain reported posterior  elbow over dorsal forearm into hand /thumb  EDEMA: improved greatly  COGNITION: Overall cognitive status: Within  functional limits for tasks assessed      TODAY'S TREATMENT:                                                                                                                              DATE: 06/26/23  Patient arrive with radial palsy splint in place.  Observed patient using left grip carrying some light objects  - able to grasp and release light objects Patient reports using it few times a day for 30 minutes.  REINFORCE TO USE WRIST SPLINTS TO DECREASE OVER STRETCHING OF EXTENSORS   Patient report less popping and clicking at the elbow.  Patient showed increase elbow extension from last time.    Last appt with DR Carola Frost  xray showed good healing -and allowed to do AROM in sitting over head for shoulder including Abd  Nerve pain - better with  medication for nerve pain   Scar still tender and adhere  -but improving Tightness over bicep limiting elbow extention but improving  Done moist heat to elbow in supine  for extention stretch  Done soft tissue massage to scar  as well as bicep to increase elbow extension as well as scar adhesion.  Mini massage done over cica scar pad for pt to tolerate  Use extractor with elbow extension few times without increased pain PROM and active range of motion elbow extension to -5 in session Reviewed with patient again scar massage as well as use of Cica -Care scar pad.  prior to active assisted range of motion for elbow flexion extension in 3 positions 10 reps reps Slight pull keeping it under 1/10   REINFORCE FOR Pt to do at home -heat for elbow stretch at work  And 5 x day at work standing against wall doing elbow ext 3 position 10 reps each  Keep shoulder back   Cont scapula retraction multiple times during the day Done shoulder AAROM on wall rolling ball for flexion and ABD  And on railing of stairs- scapula retraction , protraction, elbow extention and flexion  20 reps Light therapy ball- sup/pro , then sup/pro with bounce and catch  With  and without wrist splint   Reinforced with patient importance of wearing a wrist brace.  Over edge of table down place and hold the digit extension. Patient able to maintain -60-70 [facility] extension.  Fatigue Encouraged also active assisted range of motion for elbow extension without gravity patient finger to neutral. Reinforce and done placed on hold for wrist extension against gravity.  Thumb palmar abduction able to maintain in position doing 10 reps. Able to add rubber band for palmar abduction.  Patient to focus  to keeping it to the height of second digit.         PATIENT EDUCATION: Education details: Findings of evaluation and home program as well as splint wearing Person educated: Patient Education method: Explanation, Demonstration, Tactile cues, Verbal cues, and Handouts Education comprehension: verbalized understanding, returned demonstration, verbal cues required, tactile cues required, and needs further education   GOALS: Goals reviewed with patient? Yes  SHORT TERM GOALS: Target date: 4 wks   Patient to be independent and wearing splints correctly to prevent flexion contractures as well as facilitating functional use of left hand Baseline: Patient arrived with wrist splint on but report not really wearing it.  Patient with some discomfort at wrist as well as tightness with wrist extension.  Patient to wear wrist splint when doing elbow and shoulder exercises.  Info provided to order prefab radial nerve splint. Goal status: INITIAL  2.  Patient to be independent in home program to initiate passive range of motion and active assisted range of motion to left shoulder to prevent stiffness until next appointment with surgeon Baseline: Patient reports she has been doing active range of motion with shoulder overhead ;not wearing a sling or compression and not really wearing any more wrist splint or  sleeping with her splint Goal status: INITIAL NITIAL  LONG TERM GOALS: Target  date: 12 wks  Patient to be independent in home program and progression as radial nerve improves to facilitate wrist extension and thumb and digit extension. Baseline: Stiffness in wrist extension.  No wrist extension against gravity.  Partial active assisted range of motion to neutral without gravity on table no digit extension and limited or impaired thumb radial and palmar abduction. Goal status: INITIAL  2.  Left shoulder active range of motion increased to within functional limits to be able to initiate strengthening Baseline: Only passive and active assisted range of motion in supine for shoulder flexion and passive range of motion for shoulder abduction in supine to 90-surgery 05-17-2023 Goal status: INITIAL  3.  Left elbow flexion and extension improve to within functional limits for patient to reach down to feet as well as washing comb hair. Baseline: Elbow extension coming in -50 and during session after heat in supine with active assisted range of motion -38, flexion 130 degrees. Goal status: INITIAL  4.  Upgrade goals as patient is progressing with fracture healing and radial nerve Baseline: Patient only had surgery about 12 days ago. Goal status: INITIAL  5.  Patient to be independent in scar mobilization to increase motion and elbow flexion and extension. Baseline: Steri-Strips still in place on incision over midshaft of humerus Goal status: INITIAL  ASSESSMENT:  CLINICAL IMPRESSION: Patient seen for occupational therapy for left midshaft humerus fracture with ORIF by Dr. Carola Frost patient also has a radial nerve injury from fall.  Pt had appt with DR Carola Frost  in past  xray looked and showed good healing -can now do AROM for shoulder in sitting/standing - did get medication for nerve pain  Pt AROM for shoulder ABD and flexion  WFL - and elbow extension improve greatly  Reinforce  patient again to continue with shoulder exercises  and elbow extention stretches and AROM .  Patient   also to wear wrist splint to prevent over stretching of extensors of forearm.  Pt cont to focus on scar massage and elbow extention - Reinforce to do at work 5 x day extention of elbow - heat , stretch and AROM against wall- s reinforced with  patient to work on active assisted range of motion in place and hold for wrist extension as well as digit extension over the weekend.  Patient limited in functional use of left upper extremity because of fracture and radial nerve injury patient with decreased range of motion and strength as well as nerve injury.  Patient can benefit from skilled OT services to increase motion increase strength to return to prior level of function.  PERFORMANCE DEFICITS: in functional skills including ADLs, IADLs, coordination, sensation, edema, ROM, strength, pain, flexibility, decreased knowledge of precautions, decreased knowledge of use of DME, and UE functional use,     IMPAIRMENTS: are limiting patient from ADLs, IADLs, rest and sleep, work, play, leisure, and social participation.   COMORBIDITIES: has no other co-morbidities that affects occupational performance. Patient will benefit from skilled OT to address above impairments and improve overall function.  MODIFICATION OR ASSISTANCE TO COMPLETE EVALUATION: Min-Moderate modification of tasks or assist with assess necessary to complete an evaluation.  OT OCCUPATIONAL PROFILE AND HISTORY: Detailed assessment: Review of records and additional review of physical, cognitive, psychosocial history related to current functional performance.  CLINICAL DECISION MAKING: Moderate - several treatment options, min-mod task modification necessary  REHAB POTENTIAL: Good  EVALUATION COMPLEXITY: Moderate      PLAN:  OT FREQUENCY: 1-2x/week  OT DURATION: 12 weeks  PLANNED INTERVENTIONS: 97535 self care/ADL training, 96295 therapeutic exercise, 97140 manual therapy, 97039 fluidotherapy, 97010 moist heat, 97034 contrast bath,  scar mobilization, passive range of motion, patient/family education, and DME and/or AE instructions     CONSULTED AND AGREED WITH PLAN OF CARE: Patient     Oletta Cohn, OTR/L,CLT 06/26/2023, 3:31 PM        C

## 2023-07-02 ENCOUNTER — Ambulatory Visit: Payer: BC Managed Care – PPO | Attending: Orthopedic Surgery | Admitting: Occupational Therapy

## 2023-07-02 DIAGNOSIS — M25632 Stiffness of left wrist, not elsewhere classified: Secondary | ICD-10-CM | POA: Insufficient documentation

## 2023-07-02 DIAGNOSIS — M25612 Stiffness of left shoulder, not elsewhere classified: Secondary | ICD-10-CM | POA: Insufficient documentation

## 2023-07-02 DIAGNOSIS — L905 Scar conditions and fibrosis of skin: Secondary | ICD-10-CM | POA: Insufficient documentation

## 2023-07-02 DIAGNOSIS — M6281 Muscle weakness (generalized): Secondary | ICD-10-CM | POA: Insufficient documentation

## 2023-07-02 DIAGNOSIS — R059 Cough, unspecified: Secondary | ICD-10-CM | POA: Diagnosis not present

## 2023-07-02 DIAGNOSIS — G5632 Lesion of radial nerve, left upper limb: Secondary | ICD-10-CM | POA: Diagnosis present

## 2023-07-02 DIAGNOSIS — J189 Pneumonia, unspecified organism: Secondary | ICD-10-CM | POA: Diagnosis not present

## 2023-07-02 DIAGNOSIS — M25622 Stiffness of left elbow, not elsewhere classified: Secondary | ICD-10-CM | POA: Insufficient documentation

## 2023-07-02 NOTE — Therapy (Signed)
OUTPATIENT OCCUPATIONAL THERAPY ORTHO TREATMENT/10th visit  Patient Name: Marissa Harrison MRN: [id number] DOB:May 03, 1976, 47 y.o., female Today's Date: 07/02/2023  PCP: Darrick Penna NP REFERRING PROVIDER: Dr Carola Frost  END OF SESSION:  OT End of Session - 07/02/23 1821     Visit Number 10    Number of Visits 24    Date for OT Re-Evaluation 08/21/23    OT Start Time 1645    OT Stop Time 1729    OT Time Calculation (min) 44 min    Activity Tolerance Patient tolerated treatment well    Behavior During Therapy [facility] for tasks assessed/performed             Past Medical History:  Diagnosis Date   HSV infection    Past Surgical History:  Procedure Laterality Date   BREAST BIOPSY Right 04/13/2017   BENIGN NODULAR ADENOSIS, wing shaped marker   BREAST BIOPSY Left 11/14/2022   [place] Core Bx Ribbon clip path pending   BREAST BIOPSY Left 11/14/2022   [place] LT BREAST BX W LOC DEV 1ST LESION IMG BX SPEC US GUIDE 11/14/2022 [facility]   BREAST CYST ASPIRATION Right 2007   neg   CESAREAN SECTION  1992   ORIF HUMERUS FRACTURE Left 05/17/2023   Procedure: OPEN REDUCTION INTERNAL FIXATION (ORIF) LEFT HUMERUS FRACTURE;  Surgeon: Myrene Galas, MD;  Location: [facility] OR;  Service: Orthopedics;  Laterality: Left;   TUBAL LIGATION  2003   There are no problems to display for this patient.   ONSET DATE: 05/17/23  REFERRING DIAG: L humerus shaft fx with ORIF and Radial N injury  THERAPY DIAG:  Radial nerve palsy, left  Stiffness of left elbow, not elsewhere classified  Muscle weakness (generalized)  Stiffness of left wrist, not elsewhere classified  Scar condition and fibrosis of skin  Rationale for Evaluation and Treatment: Rehabilitation  SUBJECTIVE:   SUBJECTIVE STATEMENT: This cold weather makes my elbow and arm stiff and it is hurting today.  Today was so busy at work  PERTINENT HISTORY:  OP NOTE DR HANDY on 05/17/23: PRE-OPERATIVE DIAGNOSIS:   1. LEFT HUMERAL SHAFT FRACTURE 2.  RADIAL NERVE INJURY   POST-OPERATIVE DIAGNOSIS:   1. LEFT HUMERAL SHAFT FRACTURE 2. RADIAL NERVE INJURED BUT LARGELY INTACT   PROCEDURE:  Procedure(s): 1. OPEN REDUCTION INTERNAL FIXATION (ORIF) LEFT HUMERAL SHAFT FRACTURE  2. EXPLORATION AND NEUROPLASTY OF RADIAL NERVE      BRIEF SUMMARY AND INDICATIONS FOR PROCEDURE:  ADISON Harrison is a 47 y.o. who sustained fall resulting in displaced transverse humeral shaft fracture and complete radial nerve deficit.    PRECAUTIONS: No AROM shoulder ABD - not even gravity; AAROM and PROM for wrist , elbow and shoulder flexion; NWB ; Radial N injury     WEIGHT BEARING RESTRICTIONS: No  PAIN:  Are you having pain? 7/10 pain hand and the whole arm FALLS: Has patient fallen in last 6 months? Yes. 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  And alsothis nerve injury.  NEXT MD VISIT: middle Dec OBJECTIVE:  Note: Objective measures were completed at Evaluation unless otherwise noted.  HAND DOMINANCE: Right    UPPER EXTREMITY ROM:       Passive ROM Right eval Left eval L 05/31/23 L 06/11/23 L 06/14/23  L 06/18/23 L 06/21/23 L 06/26/23  Shoulder flexion  140 supine 120 PROM supine PROM supine 140 AROM 160 160    Shoulder abduction  PROM 90 supine PROM 90 supine PROM supine 90 95 AROM  155    Shoulder adduction          Shoulder extension     55 55    Shoulder internal rotation          Shoulder external rotation          Elbow flexion  130 AAROM 140 AAROM 150 150 150    Elbow extension  -50 coming in - in session -38 AAROM supine after heat -38 coming in - in session -25  -25 coming in - session -15 to -20 -20 to -25 -20 to -23 -15 to -18  -5 to -15  Wrist flexion  90  80      Wrist extension  On table- without gravity - to  neutral with AAROM   AROM without gravity to neutral       Wrist ulnar deviation  On table - 30  3- sliding on paper on table       Wrist radial deviation  On table - 20  20- slding on table on paper      Wrist pronation  90  90      Wrist supination  90  90      (Blank rows = not tested)  Active ROM Right eval Left eval L 06/11/23 L 06/21/23  Thumb MCP (0-60)      Thumb IP (0-80)      Thumb Radial abd/add (0-55)  Decrease at [facility]   Decrease [facility]    Thumb Palmar abd/add (0-45)  Unable to maintain - drop into flexion   Able to keep in PA 6 reps place and hold- but drop in flexion with ADD or after 6 reps  Able to maintain palmar abduction position for 10-12 reps.  Able to initiate rubber band for resistance.  Thumb Opposition to Small Finger  Unable      Index MCP (0-90)       Index PIP (0-100)       Index DIP (0-70)        Long MCP (0-90)        Long PIP (0-100)        Long DIP (0-70)        Ring MCP (0-90)        Ring PIP (0-100)        Ring DIP (0-70)        Little MCP (0-90)        Little PIP (0-100)        Little DIP (0-70)        Pt can make fist - unable to extend digits- if [facility] support at [facility] - can extend PIP's    UPPER EXTREMITY MMT:     MMT Right eval Left eval  Shoulder flexion    Shoulder abduction    Shoulder adduction    Shoulder extension    Shoulder internal rotation    Shoulder external rotation    Middle trapezius    Lower trapezius    Elbow flexion    Elbow extension    Wrist flexion    Wrist extension    Wrist ulnar deviation    Wrist radial deviation    Wrist pronation    Wrist supination    (Blank rows = not tested)  HAND FUNCTION: NT - Surgery 05/17/23  COORDINATION: Radial N injury - impaired   SENSATION: Numbness and burning pain reported posterior  elbow over dorsal forearm into hand /thumb  EDEMA: improved greatly  COGNITION: Overall cognitive status: Within functional limits for tasks assessed      TODAY'S TREATMENT:                                                                                                                               DATE: 07/02/23  Patient arrive with radial palsy splint in place.  Observed patient using left grip carrying some light objects  - able to grasp and release light objects Patient reports using her splint more. Increased stiffness and pain today because of cold weather. Decreased elbow range of motion coming in because of stiffness.   Patient report less popping and clicking at the elbow.   Last appt with DR Carola Frost  xray showed good healing -and allowed to do AROM in sitting over head for shoulder including Abd      Done paraffin to elbow in supine  for extention stretch  Done soft tissue massage to scar  as well as bicep to increase elbow extension as well as scar adhesion.   PROM and active range of motion elbow extension to -5 in session Reviewed with patient again scar massage as well as use of [employer] scar pad.  prior to active assisted range of motion for elbow flexion extension in 3 positions 10 reps reps Slight pull keeping it under 1/10   REINFORCE FOR Pt to do at home -heat for elbow stretch at work  And 5 x day at work standing against wall doing elbow ext 3 position 10 reps each  Keep shoulder back   Cont scapula retraction multiple times during the day Done passive range of motion for wrist extension without gravity on the table sliding on tissue paper.  5 reps and active assisted 1 rep, x 10, Also in supine blocked forearm passive range of motion extension with gravity 5 reps passive range of motion followed by active 2+/5 strength for wrist extension Facilitate thumb palmar and radial abduction on the lacrosse ball as well as on a thick book. For palmar abduction Thumb radial abduction placing hold of a thick book Patient able to do 3 reps.  Reinforced with patient importance of wearing a wrist brace.   Supination and pronation we will used for  patient to attempt maintaining a wrist neutral in both positions with elbow to side at 90 degree flexion. Patient able to do 12 reps.  Sent home for home program.           PATIENT EDUCATION: Education details: Findings of evaluation and home program as well as splint wearing Person educated: Patient Education method: Explanation, Demonstration, Tactile cues, Verbal cues, and Handouts Education comprehension: verbalized understanding, returned demonstration, verbal cues required, tactile cues required, and needs further education   GOALS:  Goals reviewed with patient? Yes  SHORT TERM GOALS: Target date: 4 wks   Patient to be independent and wearing splints correctly to prevent flexion contractures as well as facilitating functional use of left hand Baseline: Patient arrived with wrist splint on but report not really wearing it.  Patient with some discomfort at wrist as well as tightness with wrist extension.  Patient to wear wrist splint when doing elbow and shoulder exercises.  Info provided to order prefab radial nerve splint. Goal status: INITIAL  2.  Patient to be independent in home program to initiate passive range of motion and active assisted range of motion to left shoulder to prevent stiffness until next appointment with surgeon Baseline: Patient reports she has been doing active range of motion with shoulder overhead ;not wearing a sling or compression and not really wearing any more wrist splint or  sleeping with her splint Goal status: INITIAL NITIAL  LONG TERM GOALS: Target date: 12 wks  Patient to be independent in home program and progression as radial nerve improves to facilitate wrist extension and thumb and digit extension. Baseline: Stiffness in wrist extension.  No wrist extension against gravity.  Partial active assisted range of motion to neutral without gravity on table no digit extension and limited or impaired thumb radial and palmar abduction. Goal  status: INITIAL  2.  Left shoulder active range of motion increased to within functional limits to be able to initiate strengthening Baseline: Only passive and active assisted range of motion in supine for shoulder flexion and passive range of motion for shoulder abduction in supine to 90-surgery 05-17-2023 Goal status: INITIAL  3.  Left elbow flexion and extension improve to within functional limits for patient to reach down to feet as well as washing comb hair. Baseline: Elbow extension coming in -50 and during session after heat in supine with active assisted range of motion -38, flexion 130 degrees. Goal status: INITIAL  4.  Upgrade goals as patient is progressing with fracture healing and radial nerve Baseline: Patient only had surgery about 12 days ago. Goal status: INITIAL  5.  Patient to be independent in scar mobilization to increase motion and elbow flexion and extension. Baseline: Steri-Strips still in place on incision over midshaft of humerus Goal status: INITIAL  ASSESSMENT:  CLINICAL IMPRESSION: Patient seen for occupational therapy for left midshaft humerus fracture with ORIF by Dr. Carola Frost patient also has a radial nerve injury from fall.  Pt had appt with DR Carola Frost  in past  xray looked and showed good healing -can now do AROM for shoulder in sitting/standing - did get medication for nerve pain  Pt AROM for shoulder ABD and flexion  WFL - and elbow extension improve greatly since last week. Reinforce  patient again to continue with shoulder exercises  and elbow extention stretches and AROM .  Patient  also to wear wrist splint to prevent over stretching of extensors of forearm.  Pt cont to focus on scar massage and elbow extention - Reinforce to do at work 5 x day extention of elbow - heat , stretch and AROM against wall- s reinforced with patient to work on active assisted range of motion in place and hold for wrist extension  with and without gravity as well as thumb PA and RA.    Pt has follow up with surgeon in 2 days. Patient limited in functional use of left upper extremity because of fracture and radial nerve injury patient with decreased range of motion and  strength as well as nerve injury.  Patient can benefit from skilled OT services to increase motion increase strength to return to prior level of function.  PERFORMANCE DEFICITS: in functional skills including ADLs, IADLs, coordination, sensation, edema, ROM, strength, pain, flexibility, decreased knowledge of precautions, decreased knowledge of use of DME, and UE functional use,     IMPAIRMENTS: are limiting patient from ADLs, IADLs, rest and sleep, work, play, leisure, and social participation.   COMORBIDITIES: has no other co-morbidities that affects occupational performance. Patient will benefit from skilled OT to address above impairments and improve overall function.  MODIFICATION OR ASSISTANCE TO COMPLETE EVALUATION: Min-Moderate modification of tasks or assist with assess necessary to complete an evaluation.  OT OCCUPATIONAL PROFILE AND HISTORY: Detailed assessment: Review of records and additional review of physical, cognitive, psychosocial history related to current functional performance.  CLINICAL DECISION MAKING: Moderate - several treatment options, min-mod task modification necessary  REHAB POTENTIAL: Good  EVALUATION COMPLEXITY: Moderate      PLAN:  OT FREQUENCY: 1-2x/week  OT DURATION: 12 weeks  PLANNED INTERVENTIONS: 97535 self care/ADL training, 16109 therapeutic exercise, 97140 manual therapy, 97039 fluidotherapy, 97010 moist heat, 97034 contrast bath, scar mobilization, passive range of motion, patient/family education, and DME and/or AE instructions     CONSULTED AND AGREED WITH PLAN OF CARE: Patient     Oletta Cohn, OTR/L,CLT 07/02/2023, 6:22 PM

## 2023-07-04 DIAGNOSIS — S42322D Displaced transverse fracture of shaft of humerus, left arm, subsequent encounter for fracture with routine healing: Secondary | ICD-10-CM | POA: Diagnosis not present

## 2023-07-05 ENCOUNTER — Ambulatory Visit: Payer: BC Managed Care – PPO | Admitting: Occupational Therapy

## 2023-07-05 DIAGNOSIS — G5632 Lesion of radial nerve, left upper limb: Secondary | ICD-10-CM

## 2023-07-05 DIAGNOSIS — M25622 Stiffness of left elbow, not elsewhere classified: Secondary | ICD-10-CM

## 2023-07-05 DIAGNOSIS — M25632 Stiffness of left wrist, not elsewhere classified: Secondary | ICD-10-CM

## 2023-07-05 DIAGNOSIS — M6281 Muscle weakness (generalized): Secondary | ICD-10-CM

## 2023-07-05 NOTE — Therapy (Signed)
OUTPATIENT OCCUPATIONAL THERAPY ORTHO TREATMENT  Patient Name: Marissa Harrison MRN: [id number] DOB:[birth year], 47 y.o., female Today's Date: 07/05/2023  PCP: Marissa Penna NP REFERRING PROVIDER: Dr Marissa Harrison  END OF SESSION:  OT End of Session - 07/05/23 1958     Visit Number 11    Number of Visits 24    Date for OT Re-Evaluation 08/21/23    OT Start Time 1116    OT Stop Time 1200    OT Time Calculation (min) 44 min    Activity Tolerance Patient tolerated treatment well    Behavior During Therapy [facility] for tasks assessed/performed             Past Medical History:  Diagnosis Date   HSV infection    Past Surgical History:  Procedure Laterality Date   BREAST BIOPSY Right 04/13/2017   BENIGN NODULAR ADENOSIS, wing shaped marker   BREAST BIOPSY Left 11/14/2022   [place] Core Bx Ribbon clip path pending   BREAST BIOPSY Left 11/14/2022   [place] LT BREAST BX W LOC DEV 1ST LESION IMG BX SPEC US GUIDE 11/14/2022 [facility]   BREAST CYST ASPIRATION Right 2007   neg   CESAREAN SECTION  1992   ORIF HUMERUS FRACTURE Left 05/17/2023   Procedure: OPEN REDUCTION INTERNAL FIXATION (ORIF) LEFT HUMERUS FRACTURE;  Surgeon: Marissa Galas, MD;  Location: [facility] OR;  Service: Orthopedics;  Laterality: Left;   TUBAL LIGATION  2003   There are no problems to display for this patient.   ONSET DATE: 05/17/23  REFERRING DIAG: L humerus shaft fx with ORIF and Radial N injury  THERAPY DIAG:  Radial nerve palsy, left  Stiffness of left elbow, not elsewhere classified  Muscle weakness (generalized)  Stiffness of left wrist, not elsewhere classified  Rationale for Evaluation and Treatment: Rehabilitation  SUBJECTIVE:   SUBJECTIVE STATEMENT: Stiffness is little better.  My elbow little better.  Did see Marissa. Carola Harrison and I can start strengthening -x-rays look good  PERTINENT HISTORY:  OP NOTE Marissa Harrison on 05/17/23: PRE-OPERATIVE DIAGNOSIS:   1. LEFT HUMERAL SHAFT FRACTURE 2. RADIAL NERVE INJURY    POST-OPERATIVE DIAGNOSIS:   1. LEFT HUMERAL SHAFT FRACTURE 2. RADIAL NERVE INJURED BUT LARGELY INTACT   PROCEDURE:  Procedure(s): 1. OPEN REDUCTION INTERNAL FIXATION (ORIF) LEFT HUMERAL SHAFT FRACTURE  2. EXPLORATION AND NEUROPLASTY OF RADIAL NERVE      BRIEF SUMMARY AND INDICATIONS FOR PROCEDURE:  Marissa Harrison is a 47 y.o. who sustained fall resulting in displaced transverse humeral shaft fracture and complete radial nerve deficit.    PRECAUTIONS: No AROM shoulder ABD - not even gravity; AAROM and PROM for wrist , elbow and shoulder flexion; NWB ; Radial N injury     WEIGHT BEARING RESTRICTIONS: No  PAIN:  Are you having pain?  No number provided but pain in thumb  FALLS: Has patient fallen in last 6 months? Yes. 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  And alsothis nerve injury.  NEXT MD VISIT: middle Dec OBJECTIVE:  Note: Objective measures were completed at Evaluation unless otherwise noted.  HAND DOMINANCE: Right    UPPER EXTREMITY ROM:       Passive ROM Right eval Left eval L 05/31/23 L 06/11/23 L 06/14/23 L 06/18/23 L 06/21/23 L  06/26/23  Shoulder flexion  140 supine 120 PROM supine PROM supine 140 AROM 160 160    Shoulder abduction  PROM 90 supine PROM 90 supine PROM supine 90 95 AROM  155    Shoulder adduction          Shoulder extension     55 55    Shoulder internal rotation          Shoulder external rotation          Elbow flexion  130 AAROM 140 AAROM 150 150 150    Elbow extension  -50 coming in - in session -38 AAROM supine after heat -38 coming in - in session -25  -25 coming in - session -15 to -20 -20 to -25 -20 to -23 -15 to -18  -5 to -15  Wrist flexion  90  80      Wrist extension  On table- without gravity - to neutral with AAROM    AROM without gravity to neutral       Wrist ulnar deviation  On table - 30  3- sliding on paper on table       Wrist radial deviation  On table - 20  20- slding on table on paper      Wrist pronation  90  90      Wrist supination  90  90      (Blank rows = not tested)  Active ROM Right eval Left eval L 06/11/23 L 06/21/23  Thumb MCP (0-60)      Thumb IP (0-80)      Thumb Radial abd/add (0-55)  Decrease at Marissa Harrison   Decrease [facility]    Thumb Palmar abd/add (0-45)  Unable to maintain - drop into flexion   Able to keep in PA 6 reps place and hold- but drop in flexion with ADD or after 6 reps  Able to maintain palmar abduction position for 10-12 reps.  Able to initiate rubber band for resistance.  Thumb Opposition to Small Finger  Unable      Index MCP (0-90)       Index PIP (0-100)       Index DIP (0-70)        Long MCP (0-90)        Long PIP (0-100)        Long DIP (0-70)        Ring MCP (0-90)        Ring PIP (0-100)        Ring DIP (0-70)        Little MCP (0-90)        Little PIP (0-100)        Little DIP (0-70)        Pt can make fist - unable to extend digits- if Marissa Harrison support at Marissa Harrison - can extend PIP's    UPPER EXTREMITY MMT:     MMT Right eval Left eval  Shoulder flexion    Shoulder abduction    Shoulder adduction    Shoulder extension    Shoulder internal rotation    Shoulder external rotation    Middle trapezius    Lower trapezius    Elbow flexion    Elbow extension    Wrist flexion    Wrist extension    Wrist ulnar deviation    Wrist radial deviation    Wrist pronation    Wrist supination    (Blank rows = not tested)  HAND FUNCTION: NT -  Surgery 05/17/23  COORDINATION: Radial N injury - impaired   SENSATION: Numbness and burning pain reported posterior  elbow over dorsal forearm into hand /thumb  EDEMA: improved greatly  COGNITION: Overall cognitive status: Within functional limits for tasks assessed      TODAY'S TREATMENT:                                                                                                                               DATE: 07/05/23  Patient arrive with radial palsy splint in place.  Observed patient using left grip carrying some light objects  - able to grasp and release light objects Patient reports using her splint more. Increased elbow extension compared to last visit. Patient had a visit with Marissa. Carola Harrison.  X-ray looked good.  Marissa. Carola Harrison gave verbal order to initiate strengthening.    Done paraffin to elbow in supine  for extention stretch  Done soft tissue massage to scar  as well as bicep to increase elbow extension as well as scar adhesion.   PROM and active range of motion elbow extension to -5 in session Reviewed with patient again scar massage as well as use of [employer] -[employer] scar pad.  Initiated elbow extension and pronation in neutral using yellow Thera-Band. Can do at home in supine after elbow stretch keeping left shoulder back.  2 sets of 12 With Marissa second and third time at work against Marissa wall Reviewed with patient again to elbow extension yellow Thera-Band keeping left shoulder back palm down in neutral position 2 sets of 12 pain-free 1 pound weight against wall shoulder abduction as well as shoulder flexion 2 sets of 12  Can use 2 pound weight for scapular retractio and also 2 pound weight for shoulder extension 2 sets of 12 pain-free  Patient to hold large light empty cup with elbow to side as well as extended arm while doing pronation maintain neutral position with wrist going into supination back-and-forth 10 reps Bending forward at Marissa hips doing a figure 8 pendulum leading with dorsal hand facilitating wrist extension to neutral. Radial nerve glide followed by going into figure 8 leading with a wrist extension in combination with Marissa elbow extension and shoulder extension  Reinforced with patient importance of wearing a wrist brace.   Continue with supination and pronation we will -  will used for patient to attempt maintaining a wrist neutral in both positions with elbow to side at 90 degree flexion. Patient able to do 20 reps.  Sent home for home program.           PATIENT EDUCATION: Education details: Findings of evaluation and home program as well as splint wearing Person educated: Patient Education method: Explanation, Demonstration, Tactile cues, Verbal cues, and Handouts Education comprehension: verbalized understanding, returned demonstration, verbal cues required, tactile cues required, and needs further education   GOALS: Goals reviewed with patient? Yes  SHORT TERM GOALS: Target date: 4 wks  Patient to be independent and wearing splints correctly to prevent flexion contractures as well as facilitating functional use of left hand Baseline: Patient arrived with wrist splint on but report not really wearing it.  Patient with some discomfort at wrist as well as tightness with wrist extension.  Patient to wear wrist splint when doing elbow and shoulder exercises.  Info provided to order prefab radial nerve splint. Goal status: INITIAL  2.  Patient to be independent in home program to initiate passive range of motion and active assisted range of motion to left shoulder to prevent stiffness until next appointment with surgeon Baseline: Patient reports she has been doing active range of motion with shoulder overhead ;not wearing a sling or compression and not really wearing any more wrist splint or  sleeping with her splint Goal status: INITIAL NITIAL  LONG TERM GOALS: Target date: 12 wks  Patient to be independent in home program and progression as radial nerve improves to facilitate wrist extension and thumb and digit extension. Baseline: Stiffness in wrist extension.  No wrist extension against gravity.  Partial active assisted range of motion to neutral without gravity on table no digit extension and limited or impaired thumb radial and palmar  abduction. Goal status: INITIAL  2.  Left shoulder active range of motion increased to within functional limits to be able to initiate strengthening Baseline: Only passive and active assisted range of motion in supine for shoulder flexion and passive range of motion for shoulder abduction in supine to 90-surgery 05-17-2023 Goal status: INITIAL  3.  Left elbow flexion and extension improve to within functional limits for patient to reach down to feet as well as washing comb hair. Baseline: Elbow extension coming in -50 and during session after heat in supine with active assisted range of motion -38, flexion 130 degrees. Goal status: INITIAL  4.  Upgrade goals as patient is progressing with fracture healing and radial nerve Baseline: Patient only had surgery about 12 days ago. Goal status: INITIAL  5.  Patient to be independent in scar mobilization to increase motion and elbow flexion and extension. Baseline: Steri-Strips still in place on incision over midshaft of humerus Goal status: INITIAL  ASSESSMENT:  CLINICAL IMPRESSION: Patient seen for occupational therapy for left midshaft humerus fracture with ORIF by Marissa. Carola Harrison patient also has a radial nerve injury from fall.  Patient had a visit with Marissa. Carola Harrison yesterday x-ray showed good healing and got a verbal order to initiate strengthening. Elbow extension improve greatly - Reinforce  patient again to continue with  elbow extention stretches and AROM  -prior to doing home exercises for strengthening.  Initiated yellow Thera-Band for elbow extension and 1 pound weight for shoulder abduction and flexion.  With a 2 pound weight for scapular retraction and extension..  Patient  also to wear wrist splint to prevent over stretching of extensors of forearm.  Pt cont to focus on scar massage and elbow extention - Reinforce to do at work 2-x day strengthening exercises-as well as continuing to do several times during Marissa day wrist extension  with and  without gravity as well as thumb PA and RA.   Patient limited in functional use of left upper extremity because of fracture and radial nerve injury patient with decreased range of motion and strength as well as nerve injury.  Patient can benefit from skilled OT services to increase motion increase strength to return to prior level of function.  PERFORMANCE DEFICITS: in functional skills including ADLs, IADLs,  coordination, sensation, edema, ROM, strength, pain, flexibility, decreased knowledge of precautions, decreased knowledge of use of DME, and UE functional use,     IMPAIRMENTS: are limiting patient from ADLs, IADLs, rest and sleep, work, play, leisure, and social participation.   COMORBIDITIES: has no other co-morbidities that affects occupational performance. Patient will benefit from skilled OT to address above impairments and improve overall function.  MODIFICATION OR ASSISTANCE TO COMPLETE EVALUATION: Min-Moderate modification of tasks or assist with assess necessary to complete an evaluation.  OT OCCUPATIONAL PROFILE AND HISTORY: Detailed assessment: Review of records and additional review of physical, cognitive, psychosocial history related to current functional performance.  CLINICAL DECISION MAKING: Moderate - several treatment options, min-mod task modification necessary  REHAB POTENTIAL: Good  EVALUATION COMPLEXITY: Moderate      PLAN:  OT FREQUENCY: 1-2x/week  OT DURATION: 12 weeks  PLANNED INTERVENTIONS: 97535 self care/ADL training, 46962 therapeutic exercise, 97140 manual therapy, 97039 fluidotherapy, 97010 moist heat, 97034 contrast bath, scar mobilization, passive range of motion, patient/family education, and DME and/or AE instructions     CONSULTED AND AGREED WITH PLAN OF CARE: Patient     Oletta Cohn, OTR/L,CLT 07/05/2023, 7:59 PM

## 2023-07-10 ENCOUNTER — Ambulatory Visit: Payer: BC Managed Care – PPO | Admitting: Occupational Therapy

## 2023-07-10 DIAGNOSIS — M25612 Stiffness of left shoulder, not elsewhere classified: Secondary | ICD-10-CM

## 2023-07-10 DIAGNOSIS — G5632 Lesion of radial nerve, left upper limb: Secondary | ICD-10-CM

## 2023-07-10 DIAGNOSIS — M25622 Stiffness of left elbow, not elsewhere classified: Secondary | ICD-10-CM

## 2023-07-10 DIAGNOSIS — L905 Scar conditions and fibrosis of skin: Secondary | ICD-10-CM

## 2023-07-10 DIAGNOSIS — M6281 Muscle weakness (generalized): Secondary | ICD-10-CM

## 2023-07-10 DIAGNOSIS — M25632 Stiffness of left wrist, not elsewhere classified: Secondary | ICD-10-CM

## 2023-07-10 NOTE — Therapy (Signed)
OUTPATIENT OCCUPATIONAL THERAPY ORTHO TREATMENT  Patient Name: Marissa Harrison MRN: [id number] DOB:1976/03/30, 47 y.o., female Today's Date: 07/10/2023  PCP: Darrick Penna NP REFERRING PROVIDER: Dr Carola Frost  END OF SESSION:  OT End of Session - 07/10/23 1631     Visit Number 12    Number of Visits 24    Date for OT Re-Evaluation 08/21/23    OT Start Time 1402    OT Stop Time 1445    OT Time Calculation (min) 43 min    Activity Tolerance Patient tolerated treatment well    Behavior During Therapy [facility] for tasks assessed/performed             Past Medical History:  Diagnosis Date   HSV infection    Past Surgical History:  Procedure Laterality Date   BREAST BIOPSY Right 04/13/2017   BENIGN NODULAR ADENOSIS, wing shaped marker   BREAST BIOPSY Left 11/14/2022   [place] Core Bx Ribbon clip path pending   BREAST BIOPSY Left 11/14/2022   [place] LT BREAST BX W LOC DEV 1ST LESION IMG BX SPEC US GUIDE 11/14/2022 [facility]   BREAST CYST ASPIRATION Right 2007   neg   CESAREAN SECTION  1992   ORIF HUMERUS FRACTURE Left 05/17/2023   Procedure: OPEN REDUCTION INTERNAL FIXATION (ORIF) LEFT HUMERUS FRACTURE;  Surgeon: Myrene Galas, MD;  Location: [facility] OR;  Service: Orthopedics;  Laterality: Left;   TUBAL LIGATION  2003   There are no problems to display for this patient.   ONSET DATE: 05/17/23  REFERRING DIAG: L humerus shaft fx with ORIF and Radial N injury  THERAPY DIAG:  Radial nerve palsy, left  Stiffness of left elbow, not elsewhere classified  Muscle weakness (generalized)  Stiffness of left wrist, not elsewhere classified  Scar condition and fibrosis of skin  Stiffness of left shoulder, not elsewhere classified  Rationale for Evaluation and Treatment: Rehabilitation  SUBJECTIVE:   SUBJECTIVE STATEMENT: I done the yellow band exercises about 3 times a day.  But I admit I did not do the weight for my shoulder.  Wrist and thumb still feels the same.   PERTINENT HISTORY:   OP NOTE DR HANDY on 05/17/23: PRE-OPERATIVE DIAGNOSIS:   1. LEFT HUMERAL SHAFT FRACTURE 2. RADIAL NERVE INJURY   POST-OPERATIVE DIAGNOSIS:   1. LEFT HUMERAL SHAFT FRACTURE 2. RADIAL NERVE INJURED BUT LARGELY INTACT   PROCEDURE:  Procedure(s): 1. OPEN REDUCTION INTERNAL FIXATION (ORIF) LEFT HUMERAL SHAFT FRACTURE  2. EXPLORATION AND NEUROPLASTY OF RADIAL NERVE      BRIEF SUMMARY AND INDICATIONS FOR PROCEDURE:  Marissa Harrison is a 47 y.o. who sustained fall resulting in displaced transverse humeral shaft fracture and complete radial nerve deficit.    PRECAUTIONS: No AROM shoulder ABD - not even gravity; AAROM and PROM for wrist , elbow and shoulder flexion; NWB ; Radial N injury     WEIGHT BEARING RESTRICTIONS: No  PAIN:  Are you having pain?  No number provided but pain in thumb  FALLS: Has patient fallen in last 6 months? Yes. 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  And alsothis nerve injury.  NEXT MD VISIT: middle Dec OBJECTIVE:  Note: Objective measures were completed at Evaluation unless otherwise noted.  HAND DOMINANCE: Right  UPPER EXTREMITY ROM:       Passive ROM Right eval Left eval L 05/31/23 L 06/11/23 L 06/14/23 L 06/18/23 L 06/21/23 L 06/26/23 L 07/10/23  Shoulder flexion  140 supine 120 PROM supine PROM supine 140 AROM 160 160     Shoulder abduction  PROM 90 supine PROM 90 supine PROM supine 90 95 AROM  155     Shoulder adduction           Shoulder extension     55 55     Shoulder internal rotation           Shoulder external rotation           Elbow flexion  130 AAROM 140 AAROM 150 150 150   150  Elbow extension  -50 coming in - in session -38 AAROM supine after heat -38 coming in - in session -25  -25 coming in - session -15 to -20 -20 to  -25 -20 to -23 -15 to -18  -5 to -15 -8  Wrist flexion  90  80       Wrist extension  On table- without gravity - to neutral with AAROM   AROM without gravity to neutral      Over edge of table AROM -55 and place and hold -45   Wrist ulnar deviation  On table - 30  3- sliding on paper on table        Wrist radial deviation  On table - 20  20- slding on table on paper       Wrist pronation  90  90       Wrist supination  90  90       (Blank rows = not tested)  Active ROM Right eval Left eval L 06/11/23 L 06/21/23  Thumb MCP (0-60)      Thumb IP (0-80)      Thumb Radial abd/add (0-55)  Decrease at [facility]   Decrease [facility]    Thumb Palmar abd/add (0-45)  Unable to maintain - drop into flexion   Able to keep in PA 6 reps place and hold- but drop in flexion with ADD or after 6 reps  Able to maintain palmar abduction position for 10-12 reps.  Able to initiate rubber band for resistance.  Thumb Opposition to Small Finger  Unable      Index MCP (0-90)       Index PIP (0-100)       Index DIP (0-70)        Long MCP (0-90)        Long PIP (0-100)        Long DIP (0-70)        Ring MCP (0-90)        Ring PIP (0-100)        Ring DIP (0-70)        Little MCP (0-90)        Little PIP (0-100)        Little DIP (0-70)        Pt can make fist - unable to extend digits- if [facility] support at [facility] - can extend PIP's    UPPER EXTREMITY MMT:     MMT Right eval Left eval  Shoulder flexion    Shoulder abduction    Shoulder adduction    Shoulder extension    Shoulder internal rotation    Shoulder external rotation    Middle trapezius    Lower trapezius  Elbow flexion    Elbow extension    Wrist flexion    Wrist extension    Wrist ulnar deviation    Wrist radial deviation    Wrist pronation    Wrist supination    (Blank rows = not tested)  HAND FUNCTION: NT - Surgery 05/17/23  COORDINATION: Radial N injury - impaired   SENSATION: Numbness and burning pain reported posterior  elbow over  dorsal forearm into hand /thumb  EDEMA: improved greatly  COGNITION: Overall cognitive status: Within functional limits for tasks assessed      TODAY'S TREATMENT:                                                                                                                              DATE: 07/10/23  Patient arrive with radial palsy splint in place.  Observed patient using left grip carrying some light objects  - able to grasp and release light objects Patient reports using her splint more. Increased elbow extension compared to last visit. Patient had a visit with Dr. Carola Frost.  X-ray looked good.  Dr. Carola Frost gave verbal order to initiate strengthening. 07/04/23    Pt to cont with PROM for elbow extention at home prior to theraband HEP   Assess strength for elbow ext and flexion   Upgrade elbow extension in pronation and neutral Red Thera-Band. Can do at home in supine after elbow stretch keeping left shoulder back.  2 sets of 12 Or at  work against the wall 3 sets of 12 pain free As well is against the wall shoulder horizontal abduction with elbow extension 90 degrees height pulling apart red Thera-Band 3 sets of 12 Can repeat 2-3 times a day symptom-free Standing against the wall D1 pattern from the right hip overhead yellow Thera-Band only 12 reps twice a day  This date done very light weightbearing for forearm flexors on chair next to patient.  Prior to facilitation of wrist extension sliding in neutral on tissue on table.  Patient show increase extension to neutral with stabilization of forearm also attempt with 1 pound weight.  Patient able to do 3 sets of 6-8 reps. Wrist extension active range of motion over edge of table as well as placing hold patient show active range of motion for elbow extension to -55 and placing hold to -45.  Attempted digit extension was able to show to minus/5 strength in second and fifth digit. Add to HEP for pt   Patient to cont  hold large light  empty cup with elbow to side as well as extended arm while doing pronation maintain neutral position with wrist going into supination back-and-forth 10 reps Bending forward at the hips doing a figure 8 pendulum leading with dorsal hand facilitating wrist extension to neutral. Radial nerve glide followed by going into figure 8 leading with a wrist extension in combination with the elbow extension and shoulder extension  Reinforced with patient importance of wearing a  wrist brace.   Continue with supination and pronation we will - will used for patient to attempt maintaining a wrist neutral in both positions with elbow to side at 90 degree flexion. Patient able to do 20 reps.  Sent home for home program.           PATIENT EDUCATION: Education details: Findings of evaluation and home program as well as splint wearing Person educated: Patient Education method: Explanation, Demonstration, Tactile cues, Verbal cues, and Handouts Education comprehension: verbalized understanding, returned demonstration, verbal cues required, tactile cues required, and needs further education   GOALS: Goals reviewed with patient? Yes  SHORT TERM GOALS: Target date: 4 wks   Patient to be independent and wearing splints correctly to prevent flexion contractures as well as facilitating functional use of left hand Baseline: Patient arrived with wrist splint on but report not really wearing it.  Patient with some discomfort at wrist as well as tightness with wrist extension.  Patient to wear wrist splint when doing elbow and shoulder exercises.  Info provided to order prefab radial nerve splint. Goal status: INITIAL  2.  Patient to be independent in home program to initiate passive range of motion and active assisted range of motion to left shoulder to prevent stiffness until next appointment with surgeon Baseline: Patient reports she has been doing active range of motion with shoulder overhead ;not wearing a  sling or compression and not really wearing any more wrist splint or  sleeping with her splint Goal status: INITIAL NITIAL  LONG TERM GOALS: Target date: 12 wks  Patient to be independent in home program and progression as radial nerve improves to facilitate wrist extension and thumb and digit extension. Baseline: Stiffness in wrist extension.  No wrist extension against gravity.  Partial active assisted range of motion to neutral without gravity on table no digit extension and limited or impaired thumb radial and palmar abduction. Goal status: INITIAL  2.  Left shoulder active range of motion increased to within functional limits to be able to initiate strengthening Baseline: Only passive and active assisted range of motion in supine for shoulder flexion and passive range of motion for shoulder abduction in supine to 90-surgery 05-17-2023 Goal status: INITIAL  3.  Left elbow flexion and extension improve to within functional limits for patient to reach down to feet as well as washing comb hair. Baseline: Elbow extension coming in -50 and during session after heat in supine with active assisted range of motion -38, flexion 130 degrees. Goal status: INITIAL  4.  Upgrade goals as patient is progressing with fracture healing and radial nerve Baseline: Patient only had surgery about 12 days ago. Goal status: INITIAL  5.  Patient to be independent in scar mobilization to increase motion and elbow flexion and extension. Baseline: Steri-Strips still in place on incision over midshaft of humerus Goal status: INITIAL  ASSESSMENT:  CLINICAL IMPRESSION: Patient seen for occupational therapy for left midshaft humerus fracture with ORIF by Dr. Carola Frost patient also has a radial nerve injury from fall.  Patient had a visit with Dr. Carola Frost yesterday x-ray showed good healing and got a verbal order to initiate strengthening. Elbow extension improve greatly - Reinforce  patient again to continue with  elbow  extention stretches and AROM  -prior to doing home exercises for strengthening.  Upgrade to Red Thera-Band for elbow extension and  shoulder horizontal abduction with red Thera-Band.  ;  Shoulder flexion overhead with the D1-D2 pattern patient to do yellow Thera-Band  symptom-free only 1 set of 12.  This date was able to do light weightbearing for forearm flexor stretch prior to attempts of wrist extension and digit extension.  Patient did show to minus/5 strength with wrist extension.  Was able to facilitate partial wrist extension sliding on table even with a 1 pound cuff weight.  .Reinforce to do at work 2-x day strengthening exercises-as well as continuing to do several times during the day wrist extension  with and without gravity as well as thumb PA and RA.   Patient limited in functional use of left upper extremity because of fracture and radial nerve injury patient with decreased range of motion and strength as well as nerve injury.  Patient can benefit from skilled OT services to increase motion increase strength to return to prior level of function.  PERFORMANCE DEFICITS: in functional skills including ADLs, IADLs, coordination, sensation, edema, ROM, strength, pain, flexibility, decreased knowledge of precautions, decreased knowledge of use of DME, and UE functional use,     IMPAIRMENTS: are limiting patient from ADLs, IADLs, rest and sleep, work, play, leisure, and social participation.   COMORBIDITIES: has no other co-morbidities that affects occupational performance. Patient will benefit from skilled OT to address above impairments and improve overall function.  MODIFICATION OR ASSISTANCE TO COMPLETE EVALUATION: Min-Moderate modification of tasks or assist with assess necessary to complete an evaluation.  OT OCCUPATIONAL PROFILE AND HISTORY: Detailed assessment: Review of records and additional review of physical, cognitive, psychosocial history related to current functional  performance.  CLINICAL DECISION MAKING: Moderate - several treatment options, min-mod task modification necessary  REHAB POTENTIAL: Good  EVALUATION COMPLEXITY: Moderate      PLAN:  OT FREQUENCY: 1-2x/week  OT DURATION: 12 weeks  PLANNED INTERVENTIONS: 97535 self care/ADL training, 16109 therapeutic exercise, 97140 manual therapy, 97039 fluidotherapy, 97010 moist heat, 97034 contrast bath, scar mobilization, passive range of motion, patient/family education, and DME and/or AE instructions     CONSULTED AND AGREED WITH PLAN OF CARE: Patient     Oletta Cohn, OTR/L,CLT 07/10/2023, 4:33 PM

## 2023-07-12 ENCOUNTER — Ambulatory Visit: Payer: BC Managed Care – PPO | Admitting: Occupational Therapy

## 2023-07-12 DIAGNOSIS — M25632 Stiffness of left wrist, not elsewhere classified: Secondary | ICD-10-CM

## 2023-07-12 DIAGNOSIS — G5632 Lesion of radial nerve, left upper limb: Secondary | ICD-10-CM

## 2023-07-12 DIAGNOSIS — L905 Scar conditions and fibrosis of skin: Secondary | ICD-10-CM

## 2023-07-12 DIAGNOSIS — M6281 Muscle weakness (generalized): Secondary | ICD-10-CM

## 2023-07-12 DIAGNOSIS — M25612 Stiffness of left shoulder, not elsewhere classified: Secondary | ICD-10-CM

## 2023-07-12 DIAGNOSIS — M25622 Stiffness of left elbow, not elsewhere classified: Secondary | ICD-10-CM

## 2023-07-12 NOTE — Therapy (Signed)
OUTPATIENT OCCUPATIONAL THERAPY ORTHO TREATMENT  Patient Name: CLO ALDI MRN: [id number] DOB:September 18, 1975, 47 y.o., female Today's Date: 07/12/2023  PCP: Darrick Penna NP REFERRING PROVIDER: Dr Carola Frost  END OF SESSION:  OT End of Session - 07/12/23 1402     Visit Number 13    Number of Visits 24    Date for OT Re-Evaluation 08/21/23    OT Start Time 1402    OT Stop Time 1445    OT Time Calculation (min) 43 min    Activity Tolerance Patient tolerated treatment well    Behavior During Therapy [facility] for tasks assessed/performed             Past Medical History:  Diagnosis Date   HSV infection    Past Surgical History:  Procedure Laterality Date   BREAST BIOPSY Right 04/13/2017   BENIGN NODULAR ADENOSIS, wing shaped marker   BREAST BIOPSY Left 11/14/2022   [place] Core Bx Ribbon clip path pending   BREAST BIOPSY Left 11/14/2022   [place] LT BREAST BX W LOC DEV 1ST LESION IMG BX SPEC US GUIDE 11/14/2022 [facility]   BREAST CYST ASPIRATION Right 2007   neg   CESAREAN SECTION  1992   ORIF HUMERUS FRACTURE Left 05/17/2023   Procedure: OPEN REDUCTION INTERNAL FIXATION (ORIF) LEFT HUMERUS FRACTURE;  Surgeon: Myrene Galas, MD;  Location: [facility] OR;  Service: Orthopedics;  Laterality: Left;   TUBAL LIGATION  2003   There are no active problems to display for this patient.   ONSET DATE: 05/17/23  REFERRING DIAG: L humerus shaft fx with ORIF and Radial N injury  THERAPY DIAG:  Radial nerve palsy, left  Stiffness of left elbow, not elsewhere classified  Muscle weakness (generalized)  Stiffness of left wrist, not elsewhere classified  Scar condition and fibrosis of skin  Stiffness of left shoulder, not elsewhere classified  Rationale for Evaluation and Treatment: Rehabilitation  SUBJECTIVE:   SUBJECTIVE STATEMENT: I am doing exercises -but not doing as much as I should have - driving with the rubber band splint - wearing sometimes not anything on my wrist    PERTINENT  HISTORY:  OP NOTE DR HANDY on 05/17/23: PRE-OPERATIVE DIAGNOSIS:   1. LEFT HUMERAL SHAFT FRACTURE 2. RADIAL NERVE INJURY   POST-OPERATIVE DIAGNOSIS:   1. LEFT HUMERAL SHAFT FRACTURE 2. RADIAL NERVE INJURED BUT LARGELY INTACT   PROCEDURE:  Procedure(s): 1. OPEN REDUCTION INTERNAL FIXATION (ORIF) LEFT HUMERAL SHAFT FRACTURE  2. EXPLORATION AND NEUROPLASTY OF RADIAL NERVE      BRIEF SUMMARY AND INDICATIONS FOR PROCEDURE:  JACKELYN VANNEST is a 47 y.o. who sustained fall resulting in displaced transverse humeral shaft fracture and complete radial nerve deficit.    PRECAUTIONS: No AROM shoulder ABD - not even gravity; AAROM and PROM for wrist , elbow and shoulder flexion; NWB ; Radial N injury     WEIGHT BEARING RESTRICTIONS: No  PAIN:  Are you having pain?  Wrist and thumb pain 2-4/10 nerve pain - colder worse FALLS: Has patient fallen in last 6 months? Yes. 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  And alsothis nerve injury.  NEXT MD VISIT: middle Dec OBJECTIVE:  Note: Objective measures were completed at Evaluation unless otherwise noted.  HAND DOMINANCE: Right  UPPER EXTREMITY ROM:       Passive ROM Right eval Left eval L 05/31/23 L 06/11/23 L 06/14/23 L 06/18/23 L 06/21/23 L 06/26/23 L 07/10/23  Shoulder flexion  140 supine 120 PROM supine PROM supine 140 AROM 160 160     Shoulder abduction  PROM 90 supine PROM 90 supine PROM supine 90 95 AROM  155     Shoulder adduction           Shoulder extension     55 55     Shoulder internal rotation           Shoulder external rotation           Elbow flexion  130 AAROM 140 AAROM 150 150 150   150  Elbow extension  -50 coming in - in session -38 AAROM supine after heat -38 coming in - in session -25  -25 coming in -  session -15 to -20 -20 to -25 -20 to -23 -15 to -18  -5 to -15 -8  Wrist flexion  90  80       Wrist extension  On table- without gravity - to neutral with AAROM   AROM without gravity to neutral      Over edge of table AROM -55 and place and hold -45   Wrist ulnar deviation  On table - 30  3- sliding on paper on table        Wrist radial deviation  On table - 20  20- slding on table on paper       Wrist pronation  90  90       Wrist supination  90  90       (Blank rows = not tested)  Active ROM Right eval Left eval L 06/11/23 L 06/21/23  Thumb MCP (0-60)      Thumb IP (0-80)      Thumb Radial abd/add (0-55)  Decrease at [facility]   Decrease [facility]    Thumb Palmar abd/add (0-45)  Unable to maintain - drop into flexion   Able to keep in PA 6 reps place and hold- but drop in flexion with ADD or after 6 reps  Able to maintain palmar abduction position for 10-12 reps.  Able to initiate rubber band for resistance.  Thumb Opposition to Small Finger  Unable      Index MCP (0-90)       Index PIP (0-100)       Index DIP (0-70)        Long MCP (0-90)        Long PIP (0-100)        Long DIP (0-70)        Ring MCP (0-90)        Ring PIP (0-100)        Ring DIP (0-70)        Little MCP (0-90)        Little PIP (0-100)        Little DIP (0-70)        Pt can make fist - unable to extend digits- if [facility] support at [facility] - can extend PIP's    UPPER EXTREMITY MMT:     MMT Right eval Left eval  Shoulder flexion    Shoulder abduction    Shoulder adduction    Shoulder extension    Shoulder internal rotation    Shoulder external rotation    Middle trapezius    Lower trapezius  Elbow flexion    Elbow extension    Wrist flexion    Wrist extension    Wrist ulnar deviation    Wrist radial deviation    Wrist pronation    Wrist supination    (Blank rows = not tested)  HAND FUNCTION: NT - Surgery 05/17/23  COORDINATION: Radial N injury - impaired   SENSATION: Numbness and burning pain reported  posterior  elbow over dorsal forearm into hand /thumb  EDEMA: improved greatly  COGNITION: Overall cognitive status: Within functional limits for tasks assessed      TODAY'S TREATMENT:                                                                                                                              DATE: 07/12/23  Patient arrive with radial palsy splint in place.  Observed patient using left grip carrying some light objects  - able to grasp and release light objects Patient report not using her splint as much during the day.  But trying support arm  Complains about braces hurting her at nighttime and during the day are uncomfortable.   Increased elbow extension -but reinforced with patient again to work on at last range of motion during the day  after heat Patient had a visit with Dr. Carola Frost.  X-ray looked good.  Dr. Carola Frost gave verbal order to initiate strengthening. 07/04/23    Pt to cont with PROM for elbow extention at home prior to theraband HEP   Reviewed with patient elbow extension in pronation and neutral Red Thera-Band. Can do at home and work and standing against wall 3 sets of 12-15 Pain-free As well is against the wall shoulder horizontal abduction with elbow extension 90 degrees height pulling apart red Thera-Band 3 sets of 12 Can repeat 2-3 times a day symptom-free Standing against the wall D1 pattern from the right hip overhead yellow Thera-Band only 2 sets of 12 reps twice a day Walking to cuff weight 1 pound  Done paraffin today for 8 minutes prior to forearm flexor stretch prior to facilitation of wrist extension and digit extension. Soft tissue massage to webspace focusing on attempting thumb extension. Patient thumb palmar abduction better than extension.  Fitted patient with a [facility] neoprene to try at nighttime for more comfort but also assisting thumb out of the palm. Provided patient also with wrist and thumb wrap to try during the day for  comfort.  Done this date again several times in between exercises light weightbearing for forearm flexors on chair next to patient.   Prior to facilitation of wrist extension sliding in neutral on tissue on table.  Patient show increase extension to neutral with stabilization of forearm also attempt with 1 pound weight.  Patient able to do 3 sets of 6-8 reps. Patient to do that at home several times during the day. Wrist extension active range of motion over edge of table as well as placing hold patient show  active range of motion for elbow extension to - 45 and placing hold to - 35.  Attempted digit extension was able to show to minus/5 strength in second and fifth digit. Add to HEP for pt   Patient to cont  hold large light empty cup with elbow to side as well as extended arm while doing pronation maintain neutral position with wrist going into supination back-and-forth 10 reps Bending forward at the hips doing a figure 8 pendulum leading with dorsal hand facilitating wrist extension to neutral. Radial nerve glide followed by going into figure 8 leading with a wrist extension in combination with the elbow extension and shoulder extension  Reinforced with patient importance of wearing a wrist braces.              PATIENT EDUCATION: Education details: Findings of evaluation and home program as well as splint wearing Person educated: Patient Education method: Explanation, Demonstration, Tactile cues, Verbal cues, and Handouts Education comprehension: verbalized understanding, returned demonstration, verbal cues required, tactile cues required, and needs further education   GOALS: Goals reviewed with patient? Yes  SHORT TERM GOALS: Target date: 4 wks   Patient to be independent and wearing splints correctly to prevent flexion contractures as well as facilitating functional use of left hand Baseline: Patient arrived with wrist splint on but report not really wearing it.  Patient  with some discomfort at wrist as well as tightness with wrist extension.  Patient to wear wrist splint when doing elbow and shoulder exercises.  Info provided to order prefab radial nerve splint. Goal status: INITIAL  2.  Patient to be independent in home program to initiate passive range of motion and active assisted range of motion to left shoulder to prevent stiffness until next appointment with surgeon Baseline: Patient reports she has been doing active range of motion with shoulder overhead ;not wearing a sling or compression and not really wearing any more wrist splint or  sleeping with her splint Goal status: INITIAL NITIAL  LONG TERM GOALS: Target date: 12 wks  Patient to be independent in home program and progression as radial nerve improves to facilitate wrist extension and thumb and digit extension. Baseline: Stiffness in wrist extension.  No wrist extension against gravity.  Partial active assisted range of motion to neutral without gravity on table no digit extension and limited or impaired thumb radial and palmar abduction. Goal status: INITIAL  2.  Left shoulder active range of motion increased to within functional limits to be able to initiate strengthening Baseline: Only passive and active assisted range of motion in supine for shoulder flexion and passive range of motion for shoulder abduction in supine to 90-surgery 05-17-2023 Goal status: INITIAL  3.  Left elbow flexion and extension improve to within functional limits for patient to reach down to feet as well as washing comb hair. Baseline: Elbow extension coming in -50 and during session after heat in supine with active assisted range of motion -38, flexion 130 degrees. Goal status: INITIAL  4.  Upgrade goals as patient is progressing with fracture healing and radial nerve Baseline: Patient only had surgery about 12 days ago. Goal status: INITIAL  5.  Patient to be independent in scar mobilization to increase motion  and elbow flexion and extension. Baseline: Steri-Strips still in place on incision over midshaft of humerus Goal status: INITIAL  ASSESSMENT:  CLINICAL IMPRESSION: Patient seen for occupational therapy for left midshaft humerus fracture with ORIF by Dr. Carola Frost patient also has a radial nerve  injury from fall.  Patient had a visit with Dr. Carola Frost yesterday x-ray showed good healing and got a verbal order to initiate strengthening. Elbow extension improve greatly - Reinforce  patient again to continue with  elbow extention stretches and AROM  -prior to doing home exercises for strengthening.  Upgrade this week to Red Thera-Band for elbow extension and  shoulder horizontal abduction with red Thera-Band.  ;  Shoulder flexion overhead with the D1-D2 pattern patient to do yellow Thera-Band symptom-free only 2 set of 12.  This week was able to do light weightbearing for forearm flexor stretch prior to attempts of wrist extension and digit extension.  Patient did increase wrist extension to neutral without gravity.  And against gravity improved from last session to today. Was able to facilitate partial wrist extension sliding on table even with a 1 pound cuff weight.  .Reinforce strengthening exercises-as well as continuing to do several times during the day wrist extension  with and without gravity as well as thumb PA and RA.   Patient limited in functional use of left upper extremity because of fracture and radial nerve injury patient with decreased range of motion and strength as well as nerve injury.  Patient can benefit from skilled OT services to increase motion increase strength to return to prior level of function.  PERFORMANCE DEFICITS: in functional skills including ADLs, IADLs, coordination, sensation, edema, ROM, strength, pain, flexibility, decreased knowledge of precautions, decreased knowledge of use of DME, and UE functional use,     IMPAIRMENTS: are limiting patient from ADLs, IADLs, rest and  sleep, work, play, leisure, and social participation.   COMORBIDITIES: has no other co-morbidities that affects occupational performance. Patient will benefit from skilled OT to address above impairments and improve overall function.  MODIFICATION OR ASSISTANCE TO COMPLETE EVALUATION: Min-Moderate modification of tasks or assist with assess necessary to complete an evaluation.  OT OCCUPATIONAL PROFILE AND HISTORY: Detailed assessment: Review of records and additional review of physical, cognitive, psychosocial history related to current functional performance.  CLINICAL DECISION MAKING: Moderate - several treatment options, min-mod task modification necessary  REHAB POTENTIAL: Good  EVALUATION COMPLEXITY: Moderate      PLAN:  OT FREQUENCY: 1-2x/week  OT DURATION: 12 weeks  PLANNED INTERVENTIONS: 97535 self care/ADL training, 60454 therapeutic exercise, 97140 manual therapy, 97039 fluidotherapy, 97010 moist heat, 97034 contrast bath, scar mobilization, passive range of motion, patient/family education, and DME and/or AE instructions     CONSULTED AND AGREED WITH PLAN OF CARE: Patient     Oletta Cohn, OTR/L,CLT 07/12/2023, 3:38 PM

## 2023-07-16 ENCOUNTER — Ambulatory Visit: Payer: BC Managed Care – PPO | Admitting: Occupational Therapy

## 2023-07-16 DIAGNOSIS — M25612 Stiffness of left shoulder, not elsewhere classified: Secondary | ICD-10-CM

## 2023-07-16 DIAGNOSIS — M6281 Muscle weakness (generalized): Secondary | ICD-10-CM

## 2023-07-16 DIAGNOSIS — M25622 Stiffness of left elbow, not elsewhere classified: Secondary | ICD-10-CM

## 2023-07-16 DIAGNOSIS — M25632 Stiffness of left wrist, not elsewhere classified: Secondary | ICD-10-CM

## 2023-07-16 DIAGNOSIS — G5632 Lesion of radial nerve, left upper limb: Secondary | ICD-10-CM

## 2023-07-16 DIAGNOSIS — L905 Scar conditions and fibrosis of skin: Secondary | ICD-10-CM

## 2023-07-18 ENCOUNTER — Encounter: Payer: Self-pay | Admitting: Occupational Therapy

## 2023-07-18 NOTE — Therapy (Addendum)
OUTPATIENT OCCUPATIONAL THERAPY ORTHO TREATMENT  Patient Name: Marissa Harrison MRN: [id number] DOB:March 15, 1976, 47 y.o., female Today's Date: 07/18/2023  PCP: Darrick Penna NP REFERRING PROVIDER: Dr Carola Frost  END OF SESSION:  OT End of Session - 07/18/23 1822     Visit Number 14    Number of Visits 24    Date for OT Re-Evaluation 08/21/23    OT Start Time 0902    OT Stop Time 0952    OT Time Calculation (min) 50 min    Activity Tolerance Patient tolerated treatment well    Behavior During Therapy [facility] for tasks assessed/performed             Past Medical History:  Diagnosis Date   HSV infection    Past Surgical History:  Procedure Laterality Date   BREAST BIOPSY Right 04/13/2017   BENIGN NODULAR ADENOSIS, wing shaped marker   BREAST BIOPSY Left 11/14/2022   [place] Core Bx Ribbon clip path pending   BREAST BIOPSY Left 11/14/2022   [place] LT BREAST BX W LOC DEV 1ST LESION IMG BX SPEC US GUIDE 11/14/2022 [facility]   BREAST CYST ASPIRATION Right 2007   neg   CESAREAN SECTION  1992   ORIF HUMERUS FRACTURE Left 05/17/2023   Procedure: OPEN REDUCTION INTERNAL FIXATION (ORIF) LEFT HUMERUS FRACTURE;  Surgeon: Myrene Galas, MD;  Location: [facility] OR;  Service: Orthopedics;  Laterality: Left;   TUBAL LIGATION  2003   There are no active problems to display for this patient.   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  Left thumb pain 5/10 worse when its cold.  Difficulty with  work tasks such as typing and carrying items with 2 hands.  Husband has to help with doing her hair.  Able to do laundry now although it takes a longer time.     PERTINENT HISTORY:  OP NOTE DR HANDY on 05/17/23: PRE-OPERATIVE DIAGNOSIS:   1. LEFT HUMERAL SHAFT FRACTURE 2. RADIAL NERVE INJURY   POST-OPERATIVE DIAGNOSIS:   1. LEFT HUMERAL SHAFT FRACTURE 2. RADIAL NERVE INJURED BUT LARGELY INTACT   PROCEDURE:  Procedure(s): 1. OPEN REDUCTION INTERNAL FIXATION (ORIF) LEFT HUMERAL SHAFT FRACTURE  2. EXPLORATION AND NEUROPLASTY OF RADIAL NERVE      BRIEF SUMMARY AND INDICATIONS FOR PROCEDURE:  Marissa Harrison is a 47 y.o. who sustained fall resulting in displaced transverse humeral shaft fracture and complete radial nerve deficit.    PRECAUTIONS: No AROM shoulder ABD - not even gravity; AAROM and PROM for wrist , elbow and shoulder flexion; NWB ; Radial N injury     WEIGHT BEARING RESTRICTIONS: No  PAIN:  Are you having pain?  Wrist and thumb pain 2-4/10 nerve pain - colder worse FALLS: Has patient fallen in last 6 months? Yes. 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  And alsothis nerve injury.  NEXT MD VISIT: middle Dec OBJECTIVE:  Note: Objective measures were completed at Evaluation unless otherwise noted.  HAND DOMINANCE: Right    UPPER EXTREMITY ROM:       Passive ROM Right eval Left eval L 05/31/23 L 06/11/23 L 06/14/23 L 06/18/23 L 06/21/23 L 06/26/23 L 07/10/23  Shoulder flexion  140 supine 120 PROM supine PROM supine 140 AROM 160 160     Shoulder abduction  PROM 90 supine PROM 90 supine PROM supine 90 95 AROM  155     Shoulder adduction           Shoulder extension     55 55     Shoulder internal rotation           Shoulder external rotation            Elbow flexion  130 AAROM 140 AAROM 150 150 150   150  Elbow extension  -50 coming in - in session -38 AAROM supine after heat -38 coming in - in session -25  -25 coming in - session -15 to -20 -20 to -25 -20 to -23 -15 to -18  -5 to -15 -8  Wrist flexion  90  80       Wrist extension  On table- without gravity - to neutral with AAROM   AROM without gravity to neutral      Over edge of table AROM -55 and place and hold -45   Wrist ulnar deviation  On table - 30  3- sliding on paper on table        Wrist radial deviation  On table - 20  20- slding on table on paper       Wrist pronation  90  90       Wrist supination  90  90       (Blank rows = not tested)  Active ROM Right eval Left eval L 06/11/23 L 06/21/23  Thumb MCP (0-60)      Thumb IP (0-80)      Thumb Radial abd/add (0-55)  Decrease at [facility]   Decrease [facility]    Thumb Palmar abd/add (0-45)  Unable to maintain - drop into flexion   Able to keep in PA 6 reps place and hold- but drop in flexion with ADD or after 6 reps  Able to maintain palmar abduction position for 10-12 reps.  Able to initiate rubber band for resistance.  Thumb Opposition to Small Finger  Unable      Index MCP (0-90)       Index PIP (0-100)       Index DIP (0-70)        Long MCP (0-90)        Long PIP (0-100)        Long DIP (0-70)        Ring MCP (0-90)        Ring PIP (0-100)        Ring DIP (0-70)        Little MCP (0-90)        Little PIP (0-100)        Little DIP (0-70)        Pt can make fist - unable to extend digits- if [facility] support at [facility] - can extend PIP's    UPPER EXTREMITY MMT:     MMT Right eval Left eval  Shoulder flexion    Shoulder  abduction    Shoulder adduction    Shoulder extension    Shoulder internal rotation    Shoulder external rotation    Middle trapezius    Lower trapezius    Elbow flexion    Elbow extension    Wrist flexion    Wrist extension    Wrist ulnar deviation    Wrist radial deviation    Wrist pronation    Wrist  supination    (Blank rows = not tested)  HAND FUNCTION: NT - Surgery 05/17/23  COORDINATION: Radial N injury - impaired   SENSATION: Numbness and burning pain reported posterior  elbow over dorsal forearm into hand /thumb  EDEMA: improved greatly  COGNITION: Overall cognitive status: Within functional limits for tasks assessed  TODAY'S TREATMENT:                                                                                                                              DATE: 07/16/23   Dr. Carola Frost gave verbal order to initiate strengthening. 07/04/23  Paraffin: Paraffin for 8 minutes prior to forearm flexor stretch prior to facilitation of wrist extension and digit extension. Soft tissue massage to webspace focusing on attempting thumb extension. Patient with thumb palmar abduction better than extension.  Manual Therapy: Following paraffin, Light weightbearing for forearm flexors on chair next to patient.   Prior to facilitation of wrist extension sliding in neutral on tissue on table.  Patient demonstrates increase extension to neutral with stabilization of forearm also attempt with 1 pound weight.  Patient able to do 3 sets of 6-8 reps. Wrist extension active range of motion over edge of table as well as placing hold patient show active range of motion for elbow extension with cues and assist.  Theraband exercises: Elbow extension in pronation and neutral Red Thera-Band.  shoulder horizontal abduction against the wall with elbow extension 90 degrees height pulling apart red Thera-Band 3 sets of 12 Standing against the wall D1 pattern from the right hip overhead upgraded to red Thera-Band only 2 sets of 12 reps twice a day Pronation/supination with cues, holding built up pen for visual feedback for motion. Radial nerve glide Grip strength measured right 65#, left 25# lateral pinch R15#, left 5# 3 point pinch R 15#, left 3# 2 point pinch R 1#, left 10#  Pt to continue with  wearing wrist braces to avoid over stretching of extensors.  PROM for elbow extension at home prior to theraband HEP  Reviewed with patient elbow extension in pronation and neutral Red Thera-Band. Can do at home and work and standing against wall 3 sets of 12-15 Pain-free shoulder horizontal abduction against the wall with elbow extension 90 degrees height pulling apart red Thera-Band 3 sets of 12 Can repeat 2-3 times a day symptom-free Standing against the wall D1 pattern from the right hip overhead yellow Thera-Band only 2 sets of 12 reps twice a day Walking to cuff weight 1 pound  [facility] neoprene to try at nighttime for more comfort but also assisting thumb out of the palm. wrist and thumb wrap to try during the day for comfort.    PATIENT EDUCATION: Education details: Findings of evaluation and home program as well as splint wearing Person educated: Patient Education method: Explanation, Demonstration, Tactile cues, Verbal cues, and Handouts Education comprehension: verbalized understanding, returned demonstration, verbal cues required, tactile cues required, and needs further education   GOALS: Goals reviewed with patient? Yes  SHORT TERM GOALS: Target date: 4 wks   Patient to be independent and wearing splints correctly to prevent flexion contractures as well as facilitating functional use of left hand Baseline: Patient arrived with wrist splint on but report not really wearing it.  Patient with some discomfort at wrist as well as tightness with wrist extension.  Patient to wear wrist splint when doing elbow and shoulder exercises.  Info provided to order prefab radial nerve splint. Goal status: INITIAL  2.  Patient to be independent in home program to initiate passive range of motion and active assisted range of motion to left shoulder to prevent stiffness until next appointment with surgeon Baseline: Patient reports she has been doing active range of motion with shoulder overhead  ;not wearing a sling or compression and not really wearing any more wrist splint or  sleeping with her splint Goal status: INITIAL NITIAL  LONG TERM GOALS: Target date: 12 wks  Patient to be independent in home program and progression as radial nerve improves to facilitate wrist extension and thumb and digit extension. Baseline: Stiffness in wrist extension.  No wrist extension against gravity.  Partial active assisted range of motion to neutral without gravity on table no digit extension and limited or impaired thumb radial and palmar abduction. Goal status: INITIAL  2.  Left shoulder active range of motion increased to within functional limits to be able to initiate strengthening Baseline: Only passive and active assisted range of motion in supine for shoulder flexion and passive range of motion for shoulder abduction in supine to 90-surgery 05-17-2023 Goal status: INITIAL  3.  Left elbow flexion and extension improve to within functional limits for patient to reach down to feet as well as washing comb hair. Baseline: Elbow extension coming in -50 and during session after heat in supine with active assisted range of motion -38, flexion 130 degrees. Goal status: INITIAL  4.  Upgrade goals as patient is progressing with fracture healing and radial nerve Baseline: Patient only had surgery about 12 days ago. Goal status: INITIAL  5.  Patient to be independent in scar mobilization to increase motion and elbow flexion and extension. Baseline: Steri-Strips still in place on incision over midshaft of humerus Goal status: INITIAL  ASSESSMENT:  CLINICAL IMPRESSION: Patient seen for occupational therapy for left midshaft humerus fracture with ORIF by Dr. Carola Frost patient also has a radial nerve injury from fall.  Patient had a visit with Dr. Carola Frost yesterday x-ray showed good healing and got a verbal order to initiate strengthening. Elbow extension improve greatly - Reinforce  patient again to  continue with  elbow extention stretches and AROM  -prior to doing home exercises for strengthening.  Upgrade this week to Red Thera-Band for elbow extension and  shoulder horizontal abduction with red Thera-Band.  ;  Shoulder flexion overhead with the D1-D2 pattern patient upgraded to red Thera-Band symptom-free only 2 set of 12.  Pt able to do light weightbearing for forearm flexor stretch prior to attempts of wrist  extension and digit extension.  Wrist extension to neutral without gravity, then against gravity with improvements.  Facilitation of partial wrist extension sliding on table even with a 1 pound cuff weight.  .Reinforce strengthening exercises-as well as continuing to do several times during the day wrist extension with and without gravity as well as thumb PA and RA.   Will plan to upgrade elbow extension and shoulder horizontal ABD next session to green theraband as patients tolerates. Patient limited in functional use of left upper extremity because of fracture and radial nerve injury patient with decreased range of motion and strength as well as nerve injury.  Patient can benefit from skilled OT services to increase motion increase strength to return to prior level of function.  PERFORMANCE DEFICITS: in functional skills including ADLs, IADLs, coordination, sensation, edema, ROM, strength, pain, flexibility, decreased knowledge of precautions, decreased knowledge of use of DME, and UE functional use,     IMPAIRMENTS: are limiting patient from ADLs, IADLs, rest and sleep, work, play, leisure, and social participation.   COMORBIDITIES: has no other co-morbidities that affects occupational performance. Patient will benefit from skilled OT to address above impairments and improve overall function.  MODIFICATION OR ASSISTANCE TO COMPLETE EVALUATION: Min-Moderate modification of tasks or assist with assess necessary to complete an evaluation.  OT OCCUPATIONAL PROFILE AND HISTORY: Detailed  assessment: Review of records and additional review of physical, cognitive, psychosocial history related to current functional performance.  CLINICAL DECISION MAKING: Moderate - several treatment options, min-mod task modification necessary  REHAB POTENTIAL: Good  EVALUATION COMPLEXITY: Moderate  PLAN: OT FREQUENCY: 1-2x/week  OT DURATION: 12 weeks  PLANNED INTERVENTIONS: 97535 self care/ADL training, 16109 therapeutic exercise, 97140 manual therapy, 97039 fluidotherapy, 97010 moist heat, 97034 contrast bath, scar mobilization, passive range of motion, patient/family education, and DME and/or AE instructions  CONSULTED AND AGREED WITH PLAN OF CARE: Patient  Derrek Gu, OTR/L,CLT 07/18/2023, 6:23 PM

## 2023-07-19 ENCOUNTER — Ambulatory Visit: Payer: BC Managed Care – PPO | Admitting: Occupational Therapy

## 2023-07-19 DIAGNOSIS — G5632 Lesion of radial nerve, left upper limb: Secondary | ICD-10-CM | POA: Diagnosis not present

## 2023-07-19 DIAGNOSIS — M6281 Muscle weakness (generalized): Secondary | ICD-10-CM

## 2023-07-19 DIAGNOSIS — L905 Scar conditions and fibrosis of skin: Secondary | ICD-10-CM

## 2023-07-19 DIAGNOSIS — M25622 Stiffness of left elbow, not elsewhere classified: Secondary | ICD-10-CM

## 2023-07-19 DIAGNOSIS — M25612 Stiffness of left shoulder, not elsewhere classified: Secondary | ICD-10-CM

## 2023-07-19 DIAGNOSIS — M25632 Stiffness of left wrist, not elsewhere classified: Secondary | ICD-10-CM

## 2023-07-20 ENCOUNTER — Encounter: Payer: Self-pay | Admitting: Occupational Therapy

## 2023-07-20 NOTE — Therapy (Unsigned)
OUTPATIENT OCCUPATIONAL THERAPY ORTHO TREATMENT  Patient Name: Marissa Harrison MRN: [id number] DOB:08/17/1975, 47 y.o., female   PCP: Darrick Penna NP REFERRING PROVIDER: Dr Carola Frost  END OF SESSION:  OT End of Session - 07/22/23 1403     Visit Number 15    Number of Visits 24    Date for OT Re-Evaluation 08/21/23    OT Start Time 0900    OT Stop Time 0948    OT Time Calculation (min) 48 min    Activity Tolerance Patient tolerated treatment well    Behavior During Therapy [facility] for tasks assessed/performed              Past Medical History:  Diagnosis Date   HSV infection    Past Surgical History:  Procedure Laterality Date   BREAST BIOPSY Right 04/13/2017   BENIGN NODULAR ADENOSIS, wing shaped marker   BREAST BIOPSY Left 11/14/2022   [place] Core Bx Ribbon clip path pending   BREAST BIOPSY Left 11/14/2022   [place] LT BREAST BX W LOC DEV 1ST LESION IMG BX SPEC US GUIDE 11/14/2022 [facility]   BREAST CYST ASPIRATION Right 2007   neg   CESAREAN SECTION  1992   ORIF HUMERUS FRACTURE Left 05/17/2023   Procedure: OPEN REDUCTION INTERNAL FIXATION (ORIF) LEFT HUMERUS FRACTURE;  Surgeon: Myrene Galas, MD;  Location: [facility] OR;  Service: Orthopedics;  Laterality: Left;   TUBAL LIGATION  2003   There are no active problems to display for this patient.   ONSET DATE: 05/17/23  REFERRING DIAG: L humerus shaft fx with ORIF and Radial N injury  THERAPY DIAG:  Radial nerve palsy, left  Stiffness of left elbow, not elsewhere classified  Stiffness of left wrist, not elsewhere classified  Muscle weakness (generalized)  Scar condition and fibrosis of skin  Stiffness of left shoulder, not elsewhere classified  Rationale for Evaluation and Treatment: Rehabilitation  SUBJECTIVE:   SUBJECTIVE STATEMENT: Pt reports she is doing well, no complaints.      PERTINENT HISTORY:  OP NOTE DR HANDY on 05/17/23: PRE-OPERATIVE DIAGNOSIS:   1. LEFT HUMERAL SHAFT FRACTURE 2. RADIAL NERVE INJURY    POST-OPERATIVE DIAGNOSIS:   1. LEFT HUMERAL SHAFT FRACTURE 2. RADIAL NERVE INJURED BUT LARGELY INTACT   PROCEDURE:  Procedure(s): 1. OPEN REDUCTION INTERNAL FIXATION (ORIF) LEFT HUMERAL SHAFT FRACTURE  2. EXPLORATION AND NEUROPLASTY OF RADIAL NERVE      BRIEF SUMMARY AND INDICATIONS FOR PROCEDURE:  Marissa Harrison is a 47 y.o. who sustained fall resulting in displaced transverse humeral shaft fracture and complete radial nerve deficit.   PRECAUTIONS: No AROM shoulder ABD - not even gravity; AAROM and PROM for wrist , elbow and shoulder flexion; NWB ; Radial N injury  WEIGHT BEARING RESTRICTIONS: No  PAIN:  Are you having pain?  Wrist and thumb pain 2-4/10 nerve pain - colder worse FALLS: Has patient fallen in last 6 months? Yes. 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  And alsothis nerve injury.  NEXT MD VISIT: middle Dec OBJECTIVE:  Note: Objective measures were completed at Evaluation unless otherwise noted.  HAND DOMINANCE: Right  UPPER EXTREMITY ROM:       Passive ROM Right eval Left eval L 05/31/23 L 06/11/23 L 06/14/23 L 06/18/23 L 06/21/23 L 06/26/23  L 07/10/23  Shoulder flexion  140 supine 120 PROM supine PROM supine 140 AROM 160 160     Shoulder abduction  PROM 90 supine PROM 90 supine PROM supine 90 95 AROM  155     Shoulder adduction           Shoulder extension     55 55     Shoulder internal rotation           Shoulder external rotation           Elbow flexion  130 AAROM 140 AAROM 150 150 150   150  Elbow extension  -50 coming in - in session -38 AAROM supine after heat -38 coming in - in session -25  -25 coming in - session -15 to -20 -20 to -25 -20 to -23 -15 to -18  -5 to -15 -8  Wrist flexion  90  80       Wrist extension  On table- without gravity  - to neutral with AAROM   AROM without gravity to neutral      Over edge of table AROM -55 and place and hold -45   Wrist ulnar deviation  On table - 30  3- sliding on paper on table        Wrist radial deviation  On table - 20  20- slding on table on paper       Wrist pronation  90  90       Wrist supination  90  90       (Blank rows = not tested)  Active ROM Right eval Left eval L 06/11/23 L 06/21/23 L 07/19/23  Thumb MCP (0-60)       Thumb IP (0-80)       Thumb Radial abd/add (0-55)  Decrease at [facility]   Decrease [facility]     Thumb Palmar abd/add (0-45)  Unable to maintain - drop into flexion   Able to keep in PA 6 reps place and hold- but drop in flexion with ADD or after 6 reps  Able to maintain palmar abduction position for 10-12 reps.  Able to initiate rubber band for resistance.   Thumb Opposition to Small Finger  Unable       Index MCP (0-90)      70  Index PIP (0-100)      80  Index DIP (0-70)         Long MCP (0-90)       65  Long PIP (0-100)         Long DIP (0-70)         Ring MCP (0-90)       70  Ring PIP (0-100)       90  Ring DIP (0-70)         Little MCP (0-90)         Little PIP (0-100)         Little DIP (0-70)         Pt can make fist - unable to extend digits- if [facility] support at [facility] - can extend PIP's    UPPER EXTREMITY MMT:     MMT Right eval Left eval  Shoulder flexion    Shoulder abduction    Shoulder adduction    Shoulder extension    Shoulder internal rotation    Shoulder external rotation    Middle trapezius    Lower trapezius    Elbow flexion    Elbow extension  Wrist flexion    Wrist extension    Wrist ulnar deviation    Wrist radial deviation    Wrist pronation    Wrist supination    (Blank rows = not tested)  HAND FUNCTION: 07/19/23:   Grip strength R 65#, left 25# Lateral pinch R 15#, L5# 3 point pinch Right 15#, L3# 2 point pinch R 10#, L1#   COORDINATION: Radial N injury - impaired   SENSATION: Numbness and burning pain  reported posterior  elbow over dorsal forearm into hand /thumb  EDEMA: improved greatly  COGNITION: Overall cognitive status: Within functional limits for tasks assessed  TODAY'S TREATMENT:                                                                                                                              DATE: 07/19/23   Dr. Carola Frost gave verbal order to initiate strengthening. 07/04/23  Paraffin: Paraffin for 8 minutes prior to forearm flexor stretch prior to facilitation of wrist extension and digit extension. Soft tissue massage to webspace focusing on attempting thumb extension. Patient demonstrates thumb palmar abduction better than extension.  Manual Therapy: Following paraffin, Scar massage performed to left elbow scar Issued new cica care for night time  Place and hold of wrist with facilitation and tapping, pt demonstrates active range of motion for elbow extension with cues and assist.  Theraband exercises:  Wrist extension active range of motion over edge of table table slides with light weight bearing with facilitation of wrist extension sliding in neutral on tissue on table.  Patient demonstrates increase extension to neutral with stabilization of forearm also attempt with 1 pound weight.  Patient able to perform 3 sets of 8 reps. Elbow extension in pronation and neutral, upgraded to green Thera-Band.  shoulder horizontal abduction against the wall with elbow extension 90 degrees height pulling apart green Thera-Band 3 sets of 12 Standing against the wall D1 pattern from the right hip overhead upgraded to red Thera-Band only 2 sets of 12 reps twice a day Pronation/supination with cues, holding built up pen for visual feedback for motion. Radial nerve glide Measurements taken this date as noted above  Pt to continue with wearing wrist braces to avoid over stretching of extensors.  PROM for elbow extension at home prior to theraband HEP  Reviewed with patient elbow  extension in pronation and neutral green Thera-Band. Can do at home and work and standing against wall 3 sets of 12-15 Pain-free shoulder horizontal abduction against the wall with elbow extension 90 degrees height pulling apart green Thera-Band 3 sets of 12 Can repeat 2-3 times a day symptom-free Standing against the wall D1 pattern from the right hip overhead Red Thera-Band only 2 sets of 12 reps twice a day  [facility] neoprene to try at nighttime for more comfort but also assisting thumb out of the palm. wrist and thumb wrap to try during the day for comfort.  PATIENT EDUCATION: Education details:  Findings of evaluation and home program as well as splint wearing Person educated: Patient Education method: Explanation, Demonstration, Tactile cues, Verbal cues, and Handouts Education comprehension: verbalized understanding, returned demonstration, verbal cues required, tactile cues required, and needs further education  GOALS: Goals reviewed with patient? Yes  SHORT TERM GOALS: Target date: 4 wks   Patient to be independent and wearing splints correctly to prevent flexion contractures as well as facilitating functional use of left hand Baseline: Patient arrived with wrist splint on but report not really wearing it.  Patient with some discomfort at wrist as well as tightness with wrist extension.  Patient to wear wrist splint when doing elbow and shoulder exercises.  Info provided to order prefab radial nerve splint. Goal status: INITIAL  2.  Patient to be independent in home program to initiate passive range of motion and active assisted range of motion to left shoulder to prevent stiffness until next appointment with surgeon Baseline: Patient reports she has been doing active range of motion with shoulder overhead ;not wearing a sling or compression and not really wearing any more wrist splint or  sleeping with her splint Goal status: INITIAL  LONG TERM GOALS: Target date: 12 wks  Patient  to be independent in home program and progression as radial nerve improves to facilitate wrist extension and thumb and digit extension. Baseline: Stiffness in wrist extension.  No wrist extension against gravity.  Partial active assisted range of motion to neutral without gravity on table no digit extension and limited or impaired thumb radial and palmar abduction. Goal status: INITIAL  2.  Left shoulder active range of motion increased to within functional limits to be able to initiate strengthening Baseline: Only passive and active assisted range of motion in supine for shoulder flexion and passive range of motion for shoulder abduction in supine to 90-surgery 05-17-2023 Goal status: INITIAL  3.  Left elbow flexion and extension improve to within functional limits for patient to reach down to feet as well as washing comb hair. Baseline: Elbow extension coming in -50 and during session after heat in supine with active assisted range of motion -38, flexion 130 degrees. Goal status: INITIAL  4.  Upgrade goals as patient is progressing with fracture healing and radial nerve Baseline: Patient only had surgery about 12 days ago. Goal status: INITIAL  5.  Patient to be independent in scar mobilization to increase motion and elbow flexion and extension. Baseline: Steri-Strips still in place on incision over midshaft of humerus Goal status: INITIAL  ASSESSMENT:  CLINICAL IMPRESSION: Patient seen for occupational therapy for left midshaft humerus fracture with ORIF by Dr. Carola Frost patient also has a radial nerve injury from fall.  Patient had a visit with Dr. Carola Frost yesterday x-ray showed good healing and got a verbal order to initiate strengthening. Elbow extension improve greatly - Reinforce  patient again to continue with  elbow extention stretches and AROM  -prior to doing home exercises for strengthening.  Upgrade this week to green Thera-Band for elbow extension and  shoulder horizontal abduction  with green Thera-Band. Shoulder flexion overhead with the D1-D2 pattern patient upgraded to red Thera-Band symptom-free only 2 set of 12.  Pt able to do light weightbearing for forearm flexor stretch prior to attempts of wrist extension and digit extension.  Wrist extension to neutral without gravity, then against gravity with improvements.  Facilitation of partial wrist extension sliding on table even with a 1 pound cuff weight.  .Reinforce strengthening exercises-as well as continuing to  do several times during the day wrist extension with and without gravity as well as thumb PA and RA.  Pt upgraded to green theraband as noted above and red for D1-D2 pattern. Patient limited in functional use of left upper extremity because of fracture and radial nerve injury patient with decreased range of motion and strength as well as nerve injury.  Patient can benefit from skilled OT services to increase motion increase strength to return to prior level of function.  PERFORMANCE DEFICITS: in functional skills including ADLs, IADLs, coordination, sensation, edema, ROM, strength, pain, flexibility, decreased knowledge of precautions, decreased knowledge of use of DME, and UE functional use,    IMPAIRMENTS: are limiting patient from ADLs, IADLs, rest and sleep, work, play, leisure, and social participation.   COMORBIDITIES: has no other co-morbidities that affects occupational performance. Patient will benefit from skilled OT to address above impairments and improve overall function.  MODIFICATION OR ASSISTANCE TO COMPLETE EVALUATION: Min-Moderate modification of tasks or assist with assess necessary to complete an evaluation.  OT OCCUPATIONAL PROFILE AND HISTORY: Detailed assessment: Review of records and additional review of physical, cognitive, psychosocial history related to current functional performance.  CLINICAL DECISION MAKING: Moderate - several treatment options, min-mod task modification necessary  REHAB  POTENTIAL: Good  EVALUATION COMPLEXITY: Moderate  PLAN: OT FREQUENCY: 1-2x/week  OT DURATION: 12 weeks  PLANNED INTERVENTIONS: 97535 self care/ADL training, 16109 therapeutic exercise, 97140 manual therapy, 97039 fluidotherapy, 97010 moist heat, 97034 contrast bath, scar mobilization, passive range of motion, patient/family education, and DME and/or AE instructions  CONSULTED AND AGREED WITH PLAN OF CARE: Patient  Briza Bark, OTR/L,CLT 07/22/2023, 2:26 PM

## 2023-07-23 ENCOUNTER — Ambulatory Visit: Payer: BC Managed Care – PPO | Admitting: Occupational Therapy

## 2023-07-26 ENCOUNTER — Ambulatory Visit: Payer: BC Managed Care – PPO | Admitting: Occupational Therapy

## 2023-07-26 DIAGNOSIS — M25622 Stiffness of left elbow, not elsewhere classified: Secondary | ICD-10-CM

## 2023-07-26 DIAGNOSIS — M25612 Stiffness of left shoulder, not elsewhere classified: Secondary | ICD-10-CM

## 2023-07-26 DIAGNOSIS — M6281 Muscle weakness (generalized): Secondary | ICD-10-CM

## 2023-07-26 DIAGNOSIS — L905 Scar conditions and fibrosis of skin: Secondary | ICD-10-CM

## 2023-07-26 DIAGNOSIS — G5632 Lesion of radial nerve, left upper limb: Secondary | ICD-10-CM | POA: Diagnosis not present

## 2023-07-26 DIAGNOSIS — M25632 Stiffness of left wrist, not elsewhere classified: Secondary | ICD-10-CM

## 2023-07-27 ENCOUNTER — Encounter: Payer: Self-pay | Admitting: Occupational Therapy

## 2023-07-27 NOTE — Therapy (Unsigned)
OUTPATIENT OCCUPATIONAL THERAPY ORTHO TREATMENT  Patient Name: Marissa Harrison MRN: [id number] DOB:09/18/1975, 47 y.o., female  PCP: Darrick Penna NP REFERRING PROVIDER: Dr Carola Frost  END OF SESSION:  OT End of Session - 07/27/23 2209     Visit Number 16    Number of Visits 24    Date for OT Re-Evaluation 08/21/23    OT Start Time 1450    OT Stop Time 1530    OT Time Calculation (min) 40 min    Activity Tolerance Patient tolerated treatment well    Behavior During Therapy [facility] for tasks assessed/performed              Past Medical History:  Diagnosis Date   HSV infection    Past Surgical History:  Procedure Laterality Date   BREAST BIOPSY Right 04/13/2017   BENIGN NODULAR ADENOSIS, wing shaped marker   BREAST BIOPSY Left 11/14/2022   [place] Core Bx Ribbon clip path pending   BREAST BIOPSY Left 11/14/2022   [place] LT BREAST BX W LOC DEV 1ST LESION IMG BX SPEC US GUIDE 11/14/2022 [facility]   BREAST CYST ASPIRATION Right 2007   neg   CESAREAN SECTION  1992   ORIF HUMERUS FRACTURE Left 05/17/2023   Procedure: OPEN REDUCTION INTERNAL FIXATION (ORIF) LEFT HUMERUS FRACTURE;  Surgeon: Myrene Galas, MD;  Location: [facility] OR;  Service: Orthopedics;  Laterality: Left;   TUBAL LIGATION  2003   There are no active problems to display for this patient.   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 Exercises going well, she hasn't noticed much  change in her active wrist extension which can be frustrating.   PERTINENT HISTORY:  OP NOTE DR HANDY on 05/17/23: PRE-OPERATIVE DIAGNOSIS:   1. LEFT HUMERAL SHAFT FRACTURE 2. RADIAL NERVE INJURY   POST-OPERATIVE DIAGNOSIS:   1. LEFT HUMERAL SHAFT FRACTURE 2. RADIAL NERVE INJURED BUT LARGELY INTACT   PROCEDURE:  Procedure(s): 1. OPEN REDUCTION INTERNAL FIXATION (ORIF) LEFT HUMERAL SHAFT FRACTURE  2. EXPLORATION AND NEUROPLASTY OF RADIAL NERVE      BRIEF SUMMARY AND INDICATIONS FOR PROCEDURE:  MANERVIA MUSCO is a 47 y.o. who sustained fall resulting in displaced transverse humeral shaft fracture and complete radial nerve deficit.   PRECAUTIONS: No AROM shoulder ABD - not even gravity; AAROM and PROM for wrist , elbow and shoulder flexion; NWB ; Radial N injury  WEIGHT BEARING RESTRICTIONS: No  PAIN:  Are you having pain?  Wrist and thumb pain 2-4/10 nerve pain - colder worse FALLS: Has patient fallen in last 6 months? Yes. 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  And alsothis nerve injury.  NEXT MD VISIT: middle Dec OBJECTIVE:  Note: Objective measures were completed at Evaluation unless  otherwise noted.  HAND DOMINANCE: Right  UPPER EXTREMITY ROM:       Passive ROM Right eval Left eval L 05/31/23 L 06/11/23 L 06/14/23 L 06/18/23 L 06/21/23 L 06/26/23 L 07/10/23 L 07/24/23  Shoulder flexion  140 supine 120 PROM supine PROM supine 140 AROM 160 160      Shoulder abduction  PROM 90 supine PROM 90 supine PROM supine 90 95 AROM  155      Shoulder adduction            Shoulder extension     55 55      Shoulder internal rotation            Shoulder external rotation            Elbow flexion  130 AAROM 140 AAROM 150 150 150   150   Elbow extension  -50 coming in - in  session -38 AAROM supine after heat -38 coming in - in session -25  -25 coming in - session -15 to -20 -20 to -25 -20 to -23 -15 to -18  -5 to -15 -8 -8 but after  Exs -6  Wrist flexion  90  80        Wrist extension  On table- without gravity - to neutral with AAROM   AROM without gravity to neutral      Over edge of table AROM -55 and place and hold -45  Over edge of table AROM -55 and place and hold -45  Wrist ulnar deviation  On table - 30  3- sliding on paper on table         Wrist radial deviation  On table - 20  20- slding on table on paper        Wrist pronation  90  90        Wrist supination  90  90        (Blank rows = not tested)  Active ROM Right eval Left eval L 06/11/23 L 06/21/23 L 07/19/23 L 07/26/23  Thumb MCP (0-60)        Thumb IP (0-80)        Thumb Radial abd/add (0-55)  Decrease at [facility]   Decrease [facility]    48  Thumb Palmar abd/add (0-45)  Unable to maintain - drop into flexion   Able to keep in PA 6 reps place and hold- but drop in flexion with ADD or after 6 reps  Able to maintain palmar abduction position for 10-12 reps.  Able to initiate rubber band for resistance.  30  Thumb Opposition to Small Finger  Unable        Index MCP (0-90)      70 70  Index PIP (0-100)      80 75  Index DIP (0-70)          Long MCP (0-90)       65 65  Long PIP (0-100)        80  Long DIP (0-70)          Ring MCP (0-90)       70 70  Ring PIP (0-100)       90 80  Ring DIP (0-70)          Little MCP (0-90)          Little PIP (0-100)          Little DIP (0-70)  Pt can make fist - unable to extend digits- if [facility] support at [facility] - can extend PIP's    UPPER EXTREMITY MMT:     MMT Right eval Left eval  Shoulder flexion    Shoulder abduction    Shoulder adduction    Shoulder extension    Shoulder internal rotation    Shoulder external rotation    Middle trapezius    Lower trapezius    Elbow flexion    Elbow extension    Wrist flexion    Wrist extension    Wrist  ulnar deviation    Wrist radial deviation    Wrist pronation    Wrist supination    (Blank rows = not tested)  HAND FUNCTION: 07/19/23:   Grip strength R 65#, left 25# Lateral pinch R 15#, L5# 3 point pinch Right 15#, L3# 2 point pinch R 10#, L1#   COORDINATION: Radial N injury - impaired   SENSATION: Numbness and burning pain reported posterior  elbow over dorsal forearm into hand /thumb  EDEMA: improved greatly  COGNITION: Overall cognitive status: Within functional limits for tasks assessed  TODAY'S TREATMENT:                                                                                                                              DATE: 07/26/23   Dr. Carola Frost gave verbal order to initiate strengthening. 07/04/23  Paraffin: Paraffin for 8 minutes prior to forearm flexor stretch prior to facilitation of wrist extension and digit extension. Soft tissue massage to webspace focusing on attempting thumb extension.   Manual Therapy: Following paraffin, Scar massage performed to left elbow scar, pt dropped her cica care when leaving last session, she was able to use the old one but the stickiness has gone, she received her new pad today to wear this week.  Pt seen this date for facilitation of wrist extension with place and hold of wrist with facilitation and tapping, pt demonstrates active range of motion for elbow extension with cues and assist.  Theraband exercises:  Wrist extension active range of motion over edge of table table slides with light weight bearing with facilitation of wrist extension sliding in neutral on tissue on table.  Patient demonstrates increase extension to neutral with stabilization of forearm also attempt with 1 pound weight.  Patient able to perform 3 sets of 8 reps.  Measurements taken this date as noted above in flow sheets  Pt to continue with wearing wrist braces to avoid over stretching of extensors.  PROM for elbow extension at home prior to  theraband HEP  Elbow extension in pronation and neutral, upgraded to green Thera-Band.  shoulder horizontal abduction against the wall with elbow extension 90 degrees height pulling apart green Thera-Band 3 sets of 12 Standing against the wall D1 pattern from the right hip overhead upgraded to red Thera-Band only 2 sets of 12 reps twice a day Pronation/supination with cues, holding built up  pen for visual feedback for motion. Radial nerve glide Can perform at home and work and standing against wall 3 sets of 12-15 shoulder horizontal abduction against the wall with elbow extension 90 degrees height pulling apart green Thera-Band 3 sets of 12 Can repeat 2-3 times a day symptom-free Standing against the wall D1 pattern from the right hip overhead Red Thera-Band only 2-3 sets of 12 reps twice a day  CMC neoprene to try at nighttime for more comfort but also assisting thumb out of the palm. wrist and thumb wrap to try during the day for comfort.  PATIENT EDUCATION: Education details: Findings of evaluation and home program as well as splint wearing Person educated: Patient Education method: Explanation, Demonstration, Tactile cues, Verbal cues, and Handouts Education comprehension: verbalized understanding, returned demonstration, verbal cues required, tactile cues required, and needs further education  GOALS: Goals reviewed with patient? Yes  SHORT TERM GOALS: Target date: 4 wks   Patient to be independent and wearing splints correctly to prevent flexion contractures as well as facilitating functional use of left hand Baseline: Patient arrived with wrist splint on but report not really wearing it.  Patient with some discomfort at wrist as well as tightness with wrist extension.  Patient to wear wrist splint when doing elbow and shoulder exercises.  Info provided to order prefab radial nerve splint. Goal status: INITIAL  2.  Patient to be independent in home program to initiate passive range  of motion and active assisted range of motion to left shoulder to prevent stiffness until next appointment with surgeon Baseline: Patient reports she has been doing active range of motion with shoulder overhead ;not wearing a sling or compression and not really wearing any more wrist splint or  sleeping with her splint Goal status: INITIAL  LONG TERM GOALS: Target date: 12 wks  Patient to be independent in home program and progression as radial nerve improves to facilitate wrist extension and thumb and digit extension. Baseline: Stiffness in wrist extension.  No wrist extension against gravity.  Partial active assisted range of motion to neutral without gravity on table no digit extension and limited or impaired thumb radial and palmar abduction. Goal status: INITIAL  2.  Left shoulder active range of motion increased to within functional limits to be able to initiate strengthening Baseline: Only passive and active assisted range of motion in supine for shoulder flexion and passive range of motion for shoulder abduction in supine to 90-surgery 05-17-2023 Goal status: INITIAL  3.  Left elbow flexion and extension improve to within functional limits for patient to reach down to feet as well as washing comb hair. Baseline: Elbow extension coming in -50 and during session after heat in supine with active assisted range of motion -38, flexion 130 degrees. Goal status: INITIAL  4.  Upgrade goals as patient is progressing with fracture healing and radial nerve Baseline: Patient only had surgery about 12 days ago. Goal status: INITIAL  5.  Patient to be independent in scar mobilization to increase motion and elbow flexion and extension. Baseline: Steri-Strips still in place on incision over midshaft of humerus Goal status: INITIAL  ASSESSMENT:  CLINICAL IMPRESSION: Patient seen for occupational therapy for left midshaft humerus fracture with ORIF by Dr. Carola Frost patient also has a radial nerve  injury from fall.  Patient had a visit with Dr. Carola Frost yesterday x-ray showed good healing and got a verbal order to initiate strengthening. Elbow extension improve greatly - Reinforce  patient again to continue with  elbow extention stretches and AROM  -prior to doing home exercises for strengthening.   Pt able to do light weightbearing for forearm flexor stretch prior to attempts of wrist extension and digit extension.  Wrist extension to neutral without gravity, then against gravity with improvements.  Facilitation of partial wrist extension sliding on table even with a 1 pound cuff weight.  .Reinforce strengthening exercises-as well as continuing to do several times during the day wrist extension with and without gravity as well as thumb PA and RA. Pt continues to use green theraband as noted above and red for D1-D2 pattern. Patient limited in functional use of left upper extremity because of fracture and radial nerve injury patient with decreased range of motion and strength as well as nerve injury.  Patient can benefit from skilled OT services to increase motion increase strength to return to prior level of function.  PERFORMANCE DEFICITS: in functional skills including ADLs, IADLs, coordination, sensation, edema, ROM, strength, pain, flexibility, decreased knowledge of precautions, decreased knowledge of use of DME, and UE functional use,    IMPAIRMENTS: are limiting patient from ADLs, IADLs, rest and sleep, work, play, leisure, and social participation.   COMORBIDITIES: has no other co-morbidities that affects occupational performance. Patient will benefit from skilled OT to address above impairments and improve overall function.  MODIFICATION OR ASSISTANCE TO COMPLETE EVALUATION: Min-Moderate modification of tasks or assist with assess necessary to complete an evaluation.  OT OCCUPATIONAL PROFILE AND HISTORY: Detailed assessment: Review of records and additional review of physical, cognitive,  psychosocial history related to current functional performance.  CLINICAL DECISION MAKING: Moderate - several treatment options, min-mod task modification necessary  REHAB POTENTIAL: Good  EVALUATION COMPLEXITY: Moderate  PLAN: OT FREQUENCY: 1-2x/week  OT DURATION: 12 weeks  PLANNED INTERVENTIONS: 97535 self care/ADL training, 24401 therapeutic exercise, 97140 manual therapy, 97039 fluidotherapy, 97010 moist heat, 97034 contrast bath, scar mobilization, passive range of motion, patient/family education, and DME and/or AE instructions  CONSULTED AND AGREED WITH PLAN OF CARE: Patient  Arbor Leer, OTR/L,CLT 07/28/2023, 11:30 AM

## 2023-07-30 ENCOUNTER — Ambulatory Visit: Payer: BC Managed Care – PPO | Admitting: Occupational Therapy

## 2023-07-30 DIAGNOSIS — M25622 Stiffness of left elbow, not elsewhere classified: Secondary | ICD-10-CM

## 2023-07-30 DIAGNOSIS — M6281 Muscle weakness (generalized): Secondary | ICD-10-CM

## 2023-07-30 DIAGNOSIS — G5632 Lesion of radial nerve, left upper limb: Secondary | ICD-10-CM

## 2023-07-30 DIAGNOSIS — M25632 Stiffness of left wrist, not elsewhere classified: Secondary | ICD-10-CM

## 2023-07-30 DIAGNOSIS — L905 Scar conditions and fibrosis of skin: Secondary | ICD-10-CM

## 2023-07-30 DIAGNOSIS — M25612 Stiffness of left shoulder, not elsewhere classified: Secondary | ICD-10-CM

## 2023-07-31 ENCOUNTER — Encounter: Payer: Self-pay | Admitting: Occupational Therapy

## 2023-07-31 NOTE — Therapy (Signed)
OUTPATIENT OCCUPATIONAL THERAPY ORTHO TREATMENT  Patient Name: Marissa Harrison MRN: [id number] DOB:11-Aug-1975, 47 y.o., female  PCP: Darrick Penna NP REFERRING PROVIDER: Dr Carola Frost  END OF SESSION:  OT End of Session - 07/31/23 2102     Visit Number 17    Number of Visits 24    Date for OT Re-Evaluation 08/21/23    OT Start Time 1515    OT Stop Time 1604    OT Time Calculation (min) 49 min    Activity Tolerance Patient tolerated treatment well    Behavior During Therapy [facility] for tasks assessed/performed              Past Medical History:  Diagnosis Date   HSV infection    Past Surgical History:  Procedure Laterality Date   BREAST BIOPSY Right 04/13/2017   BENIGN NODULAR ADENOSIS, wing shaped marker   BREAST BIOPSY Left 11/14/2022   [place] Core Bx Ribbon clip path pending   BREAST BIOPSY Left 11/14/2022   [place] LT BREAST BX W LOC DEV 1ST LESION IMG BX SPEC US GUIDE 11/14/2022 [facility]   BREAST CYST ASPIRATION Right 2007   neg   CESAREAN SECTION  1992   ORIF HUMERUS FRACTURE Left 05/17/2023   Procedure: OPEN REDUCTION INTERNAL FIXATION (ORIF) LEFT HUMERUS FRACTURE;  Surgeon: Myrene Galas, MD;  Location: [facility] OR;  Service: Orthopedics;  Laterality: Left;   TUBAL LIGATION  2003   There are no active problems to display for this patient.   ONSET DATE: 05/17/23  REFERRING DIAG: L humerus shaft fx with ORIF and Radial N injury  THERAPY DIAG:  Radial nerve palsy, left  Stiffness of left elbow, not elsewhere classified  Muscle weakness (generalized)  Stiffness of left wrist, not elsewhere classified  Scar condition and fibrosis of skin  Stiffness of left shoulder, not elsewhere classified  Rationale for Evaluation and Treatment: Rehabilitation  SUBJECTIVE:   SUBJECTIVE STATEMENT:  Pt denies any pain at rest, some tenderness to the thumb when touching the top of it.     PERTINENT HISTORY:  OP NOTE DR HANDY on 05/17/23: PRE-OPERATIVE DIAGNOSIS:   1. LEFT HUMERAL  SHAFT FRACTURE 2. RADIAL NERVE INJURY   POST-OPERATIVE DIAGNOSIS:   1. LEFT HUMERAL SHAFT FRACTURE 2. RADIAL NERVE INJURED BUT LARGELY INTACT   PROCEDURE:  Procedure(s): 1. OPEN REDUCTION INTERNAL FIXATION (ORIF) LEFT HUMERAL SHAFT FRACTURE  2. EXPLORATION AND NEUROPLASTY OF RADIAL NERVE      BRIEF SUMMARY AND INDICATIONS FOR PROCEDURE:  Marissa Harrison is a 47 y.o. who sustained fall resulting in displaced transverse humeral shaft fracture and complete radial nerve deficit.   PRECAUTIONS: No AROM shoulder ABD - not even gravity; AAROM and PROM for wrist , elbow and shoulder flexion; NWB ; Radial N injury  WEIGHT BEARING RESTRICTIONS: No  PAIN:  Are you having pain?  Wrist and thumb pain 2-4/10 nerve pain - colder worse FALLS: Has patient fallen in last 6 months? Yes. 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  And alsothis nerve injury.  NEXT MD VISIT: middle Dec OBJECTIVE:  Note: Objective measures were completed at Evaluation unless otherwise noted.  HAND DOMINANCE: Right  UPPER EXTREMITY ROM:       Passive ROM Right eval Left eval L 05/31/23 L 06/11/23  L 06/14/23 L 06/18/23 L 06/21/23 L 06/26/23 L 07/10/23 L 07/24/23  Shoulder flexion  140 supine 120 PROM supine PROM supine 140 AROM 160 160      Shoulder abduction  PROM 90 supine PROM 90 supine PROM supine 90 95 AROM  155      Shoulder adduction            Shoulder extension     55 55      Shoulder internal rotation            Shoulder external rotation            Elbow flexion  130 AAROM 140 AAROM 150 150 150   150   Elbow extension  -50 coming in - in session -38 AAROM supine after heat -38 coming in - in session -25  -25 coming in - session -15 to -20 -20 to -25 -20 to -23 -15 to -18  -5 to -15 -8 -8 but after   Exs -6  Wrist flexion  90  80        Wrist extension  On table- without gravity - to neutral with AAROM   AROM without gravity to neutral      Over edge of table AROM -55 and place and hold -45  Over edge of table AROM -55 and place and hold -45  Wrist ulnar deviation  On table - 30  3- sliding on paper on table         Wrist radial deviation  On table - 20  20- slding on table on paper        Wrist pronation  90  90        Wrist supination  90  90        (Blank rows = not tested)  Active ROM Right eval Left eval L 06/11/23 L 06/21/23 L 07/19/23 L 07/26/23  Thumb MCP (0-60)        Thumb IP (0-80)        Thumb Radial abd/add (0-55)  Decrease at [facility]   Decrease [facility]    48  Thumb Palmar abd/add (0-45)  Unable to maintain - drop into flexion   Able to keep in PA 6 reps place and hold- but drop in flexion with ADD or after 6 reps  Able to maintain palmar abduction position for 10-12 reps.  Able to initiate rubber band for resistance.  30  Thumb Opposition to Small Finger  Unable        Index MCP (0-90)      70 70  Index PIP (0-100)      80 75  Index DIP (0-70)          Long MCP (0-90)       65 65  Long PIP (0-100)        80  Long DIP (0-70)          Ring MCP (0-90)       70 70  Ring PIP (0-100)       90 80  Ring DIP (0-70)          Little MCP (0-90)          Little PIP (0-100)          Little DIP (0-70)          Pt can make fist - unable to extend digits- if [facility] support at [facility] - can extend PIP's    UPPER EXTREMITY MMT:  MMT Right eval Left eval  Shoulder flexion    Shoulder abduction    Shoulder adduction    Shoulder extension    Shoulder internal rotation    Shoulder external rotation    Middle trapezius    Lower trapezius    Elbow flexion    Elbow extension    Wrist flexion    Wrist extension    Wrist ulnar deviation    Wrist radial deviation    Wrist pronation    Wrist supination    (Blank rows = not tested)  HAND FUNCTION: 07/19/23:   Grip strength R 65#,  left 25# Lateral pinch R 15#, L5# 3 point pinch Right 15#, L3# 2 point pinch R 10#, L1#   COORDINATION: Radial N injury - impaired   SENSATION: Numbness and burning pain reported posterior  elbow over dorsal forearm into hand /thumb  EDEMA: improved greatly  COGNITION: Overall cognitive status: Within functional limits for tasks assessed  TODAY'S TREATMENT:                                                                                                                              DATE: 07/30/23  Pt reports she hasn't noticed much change in the wrist and still doing exercises daily.  She realizes the nerve regeneration takes time.  She reports at her recent MD appt he thought she would be further along than she is.    Paraffin: Paraffin for 8 minutes prior to forearm flexor stretch prior to facilitation of wrist extension and digit extension. Soft tissue massage to webspace focusing on facilitation of  thumb extension and thumb ABD.  Manual Therapy: Following paraffin, Scar massage performed to left elbow scar to decrease pain, increase tissue mobility and increase ROM and to decrease risk of adhesions.  Carpal and metacarpal spreads performed, manual stretch to web space.  Theraband exercises:  Pt seen this date for facilitation of wrist extension with place and hold of wrist with facilitation and tapping, pt demonstrates active range of motion for elbow extension with cues and assist. Wrist extension active range of motion over table Table slides with light weight bearing with facilitation of wrist extension sliding in neutral on tissue on table.  Patient demonstrates increase extension to neutral with stabilization of forearm also attempt with 1 pound weight.  Patient able to perform 3 sets of 10 reps.  Added exercises for weight tower this date for upright row for 1 set of 5#, 1 set of 10# and one set of 15#, wearing wrist brace  Lat pull down for 1 set of 10#, 1 set 15, 1 set of  20#   Pt to continue with wearing wrist braces to avoid over stretching of extensors.  PROM for elbow extension at home prior to theraband HEP  Elbow extension in pronation and neutral, upgraded to blue Thera-Band.  shoulder horizontal abduction against the wall with elbow extension 90 degrees height pulling apart blue Thera-Band  2 sets of 10-12 Standing against the wall D1 pattern from the right hip overhead upgraded to green Thera-Band only 2 sets of 12 reps twice a day Pronation/supination  Radial nerve glides  CMC neoprene to try at nighttime for more comfort but also assisting thumb out of the palm. wrist and thumb wrap to try during the day for comfort.  PATIENT EDUCATION: Education details: Findings of evaluation and home program as well as splint wearing Person educated: Patient Education method: Explanation, Demonstration, Tactile cues, Verbal cues, and Handouts Education comprehension: verbalized understanding, returned demonstration, verbal cues required, tactile cues required, and needs further education  GOALS: Goals reviewed with patient? Yes  SHORT TERM GOALS: Target date: 4 wks   Patient to be independent and wearing splints correctly to prevent flexion contractures as well as facilitating functional use of left hand Baseline: Patient arrived with wrist splint on but report not really wearing it.  Patient with some discomfort at wrist as well as tightness with wrist extension.  Patient to wear wrist splint when doing elbow and shoulder exercises.  Info provided to order prefab radial nerve splint. Goal status: INITIAL  2.  Patient to be independent in home program to initiate passive range of motion and active assisted range of motion to left shoulder to prevent stiffness until next appointment with surgeon Baseline: Patient reports she has been doing active range of motion with shoulder overhead ;not wearing a sling or compression and not really wearing any more wrist  splint or  sleeping with her splint Goal status: INITIAL  LONG TERM GOALS: Target date: 12 wks  Patient to be independent in home program and progression as radial nerve improves to facilitate wrist extension and thumb and digit extension. Baseline: Stiffness in wrist extension.  No wrist extension against gravity.  Partial active assisted range of motion to neutral without gravity on table no digit extension and limited or impaired thumb radial and palmar abduction. Goal status: INITIAL  2.  Left shoulder active range of motion increased to within functional limits to be able to initiate strengthening Baseline: Only passive and active assisted range of motion in supine for shoulder flexion and passive range of motion for shoulder abduction in supine to 90-surgery 05-17-2023 Goal status: INITIAL  3.  Left elbow flexion and extension improve to within functional limits for patient to reach down to feet as well as washing comb hair. Baseline: Elbow extension coming in -50 and during session after heat in supine with active assisted range of motion -38, flexion 130 degrees. Goal status: INITIAL  4.  Upgrade goals as patient is progressing with fracture healing and radial nerve Baseline: Patient only had surgery about 12 days ago. Goal status: INITIAL  5.  Patient to be independent in scar mobilization to increase motion and elbow flexion and extension. Baseline: Steri-Strips still in place on incision over midshaft of humerus Goal status: INITIAL  ASSESSMENT:  CLINICAL IMPRESSION: Patient seen for occupational therapy for left midshaft humerus fracture with ORIF by Dr. Carola Frost patient also has a radial nerve injury from fall.  Patient had a visit with Dr. Carola Frost yesterday x-ray showed good healing and got a verbal order to initiate strengthening. Elbow extension improve greatly - Reinforce  patient again to continue with  elbow extention stretches and AROM  -prior to doing home exercises for  strengthening.   Pt able to do light weightbearing for forearm flexor stretch prior to attempts of wrist extension and digit extension.  Wrist extension to  neutral without gravity, then against gravity with improvements.  Facilitation of partial wrist extension sliding on table even with a 1 pound cuff weight.  .Reinforce strengthening exercises-as well as continuing to do several times during the day wrist extension with and without gravity as well as thumb PA and RA. Pt upgraded to blue theraband as noted above and green for D1-D2 pattern.  Added exercises with use of exercise tower with weight resistance and use of wrist support, monitoring of wrist positioning during exercises and cues for form.  Patient limited in functional use of left upper extremity because of fracture and radial nerve injury patient with decreased range of motion and strength as well as nerve injury.  Patient can benefit from skilled OT services to increase motion increase strength to return to prior level of function.  PERFORMANCE DEFICITS: in functional skills including ADLs, IADLs, coordination, sensation, edema, ROM, strength, pain, flexibility, decreased knowledge of precautions, decreased knowledge of use of DME, and UE functional use,    IMPAIRMENTS: are limiting patient from ADLs, IADLs, rest and sleep, work, play, leisure, and social participation.   COMORBIDITIES: has no other co-morbidities that affects occupational performance. Patient will benefit from skilled OT to address above impairments and improve overall function.  MODIFICATION OR ASSISTANCE TO COMPLETE EVALUATION: Min-Moderate modification of tasks or assist with assess necessary to complete an evaluation.  OT OCCUPATIONAL PROFILE AND HISTORY: Detailed assessment: Review of records and additional review of physical, cognitive, psychosocial history related to current functional performance.  CLINICAL DECISION MAKING: Moderate - several treatment options,  min-mod task modification necessary  REHAB POTENTIAL: Good  EVALUATION COMPLEXITY: Moderate  PLAN: OT FREQUENCY: 1-2x/week  OT DURATION: 12 weeks  PLANNED INTERVENTIONS: 97535 self care/ADL training, 95284 therapeutic exercise, 97140 manual therapy, 97039 fluidotherapy, 97010 moist heat, 97034 contrast bath, scar mobilization, passive range of motion, patient/family education, and DME and/or AE instructions  CONSULTED AND AGREED WITH PLAN OF CARE: Patient  Jefferie Holston, OTR/L,CLT 07/31/2023, 9:04 PM

## 2023-08-02 ENCOUNTER — Ambulatory Visit: Payer: BC Managed Care – PPO | Admitting: Occupational Therapy

## 2023-08-03 ENCOUNTER — Encounter: Payer: Self-pay | Admitting: Occupational Therapy

## 2023-08-03 ENCOUNTER — Ambulatory Visit: Payer: BC Managed Care – PPO | Attending: Orthopedic Surgery | Admitting: Occupational Therapy

## 2023-08-03 DIAGNOSIS — G5632 Lesion of radial nerve, left upper limb: Secondary | ICD-10-CM | POA: Insufficient documentation

## 2023-08-03 DIAGNOSIS — M25622 Stiffness of left elbow, not elsewhere classified: Secondary | ICD-10-CM | POA: Insufficient documentation

## 2023-08-03 DIAGNOSIS — L905 Scar conditions and fibrosis of skin: Secondary | ICD-10-CM | POA: Insufficient documentation

## 2023-08-03 DIAGNOSIS — M6281 Muscle weakness (generalized): Secondary | ICD-10-CM | POA: Insufficient documentation

## 2023-08-03 DIAGNOSIS — M25632 Stiffness of left wrist, not elsewhere classified: Secondary | ICD-10-CM | POA: Diagnosis present

## 2023-08-03 DIAGNOSIS — M25612 Stiffness of left shoulder, not elsewhere classified: Secondary | ICD-10-CM | POA: Insufficient documentation

## 2023-08-03 NOTE — Therapy (Signed)
 OUTPATIENT OCCUPATIONAL THERAPY ORTHO TREATMENT  Patient Name: Marissa Harrison MRN: [id number] DOB:1976/04/26, 48 y.o., female  PCP: Harvey NP REFERRING PROVIDER: Dr Celena  END OF SESSION:  OT End of Session - 08/03/23 0909     Visit Number 18    Number of Visits 24    Date for OT Re-Evaluation 08/21/23    OT Start Time 0903    OT Stop Time 0945    OT Time Calculation (min) 42 min    Activity Tolerance Patient tolerated treatment well    Behavior During Therapy [facility] for tasks assessed/performed              Past Medical History:  Diagnosis Date   HSV infection    Past Surgical History:  Procedure Laterality Date   BREAST BIOPSY Right 04/13/2017   BENIGN NODULAR ADENOSIS, wing shaped marker   BREAST BIOPSY Left 11/14/2022   Us  Core Bx Ribbon clip path pending   BREAST BIOPSY Left 11/14/2022   US  LT BREAST BX W LOC DEV 1ST LESION IMG BX SPEC US  GUIDE 11/14/2022 [facility]   BREAST CYST ASPIRATION Right 2007   neg   CESAREAN SECTION  1992   ORIF HUMERUS FRACTURE Left 05/17/2023   Procedure: OPEN REDUCTION INTERNAL FIXATION (ORIF) LEFT HUMERUS FRACTURE;  Surgeon: Celena Sharper, MD;  Location: [facility] OR;  Service: Orthopedics;  Laterality: Left;   TUBAL LIGATION  2003   There are no active problems to display for this patient.   ONSET DATE: 05/17/23  REFERRING DIAG: L humerus shaft fx with ORIF and Radial N injury  THERAPY DIAG:  Radial nerve palsy, left  Stiffness of left elbow, not elsewhere classified  Stiffness of left wrist, not elsewhere classified  Muscle weakness (generalized)  Scar condition and fibrosis of skin  Stiffness of left shoulder, not elsewhere classified  Rationale for Evaluation and Treatment: Rehabilitation  SUBJECTIVE:   SUBJECTIVE STATEMENT:  Pt reports she is doing well with her exercises, no pain this date.  Occasional tenderness at the thumb.  She has been working on wrist exercises but still struggles with motion due to  nerve damage.       PERTINENT HISTORY:  OP NOTE DR HANDY on 05/17/23: PRE-OPERATIVE DIAGNOSIS:   1. LEFT HUMERAL SHAFT FRACTURE 2. RADIAL NERVE INJURY   POST-OPERATIVE DIAGNOSIS:   1. LEFT HUMERAL SHAFT FRACTURE 2. RADIAL NERVE INJURED BUT LARGELY INTACT   PROCEDURE:  Procedure(s): 1. OPEN REDUCTION INTERNAL FIXATION (ORIF) LEFT HUMERAL SHAFT FRACTURE  2. EXPLORATION AND NEUROPLASTY OF RADIAL NERVE      BRIEF SUMMARY AND INDICATIONS FOR PROCEDURE:  Marissa Harrison is a 48 y.o. who sustained fall resulting in displaced transverse humeral shaft fracture and complete radial nerve deficit.   PRECAUTIONS: No AROM shoulder ABD - not even gravity; AAROM and PROM for wrist , elbow and shoulder flexion; NWB ; Radial N injury  WEIGHT BEARING RESTRICTIONS: No  PAIN:  Are you having pain?  Wrist and thumb pain 2-4/10 nerve pain - colder worse FALLS: Has patient fallen in last 6 months? Yes. 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  And alsothis nerve injury.  NEXT MD VISIT: middle Dec OBJECTIVE:  Note: Objective measures were completed at Evaluation unless otherwise noted.  HAND DOMINANCE:  Right  UPPER EXTREMITY ROM:       Passive ROM Right eval Left eval L 05/31/23 L 06/11/23 L 06/14/23 L 06/18/23 L 06/21/23 L 06/26/23 L 07/10/23 L 07/24/23  Shoulder flexion  140 supine 120 PROM supine PROM supine 140 AROM 160 160      Shoulder abduction  PROM 90 supine PROM 90 supine PROM supine 90 95 AROM  155      Shoulder adduction            Shoulder extension     55 55      Shoulder internal rotation            Shoulder external rotation            Elbow flexion  130 AAROM 140 AAROM 150 150 150   150   Elbow extension  -50 coming in - in session -38 AAROM supine after heat -38 coming  in - in session -25  -25 coming in - session -15 to -20 -20 to -25 -20 to -23 -15 to -18  -5 to -15 -8 -8 but after  Exs -6  Wrist flexion  90  80        Wrist extension  On table- without gravity - to neutral with AAROM   AROM without gravity to neutral      Over edge of table AROM -55 and place and hold -45  Over edge of table AROM -55 and place and hold -45  Wrist ulnar deviation  On table - 30  3- sliding on paper on table         Wrist radial deviation  On table - 20  20- slding on table on paper        Wrist pronation  90  90        Wrist supination  90  90        (Blank rows = not tested)  Active ROM Right eval Left eval L 06/11/23 L 06/21/23 L 07/19/23 L 07/26/23  Thumb MCP (0-60)        Thumb IP (0-80)        Thumb Radial abd/add (0-55)  Decrease at [facility]   Decrease [facility]    48  Thumb Palmar abd/add (0-45)  Unable to maintain - drop into flexion   Able to keep in PA 6 reps place and hold- but drop in flexion with ADD or after 6 reps  Able to maintain palmar abduction position for 10-12 reps.  Able to initiate rubber band for resistance.  30  Thumb Opposition to Small Finger  Unable        Index MCP (0-90)      70 70  Index PIP (0-100)      80 75  Index DIP (0-70)          Long MCP (0-90)       65 65  Long PIP (0-100)        80  Long DIP (0-70)          Ring MCP (0-90)       70 70  Ring PIP (0-100)       90 80  Ring DIP (0-70)          Little MCP (0-90)          Little PIP (0-100)          Little DIP (0-70)          Pt can make fist -  unable to extend digits- if [facility] support at [facility] - can extend PIP's    UPPER EXTREMITY MMT:     MMT Right eval Left eval  Shoulder flexion    Shoulder abduction    Shoulder adduction    Shoulder extension    Shoulder internal rotation    Shoulder external rotation    Middle trapezius    Lower trapezius    Elbow flexion    Elbow extension    Wrist flexion    Wrist extension    Wrist ulnar deviation    Wrist radial deviation     Wrist pronation    Wrist supination    (Blank rows = not tested)  HAND FUNCTION: 07/19/23:   Grip strength R 65#, left 25# Lateral pinch R 15#, L5# 3 point pinch Right 15#, L3# 2 point pinch R 10#, L1#   COORDINATION: Radial N injury - impaired   SENSATION: Numbness and burning pain reported posterior  elbow over dorsal forearm into hand /thumb  EDEMA: improved greatly  COGNITION: Overall cognitive status: Within functional limits for tasks assessed  TODAY'S TREATMENT:                                                                                                                              DATE: 08/02/22   Paraffin: Paraffin for 8 minutes to decrease pain, increase tissue mobility and increase range of motion, performed prior to forearm flexor stretch prior to facilitation of wrist extension and digit extension.  Manual Therapy: Carpal and metacarpal spreads performed, manual stretch to web space. Soft tissue massage to webspace focusing on facilitation of  thumb extension and thumb ABD.  Pt continues to work on scar massage at home for bicep area, issued new scar pad for nighttime.  Theraband exercises:  Pt seen for facilitation of wrist extension with place and hold of wrist with facilitation and tapping, various positions.  Finger extension with wrist blocked in neutral as well as in slight flexion.   Wrist extension active range of motion over table with guiding from therapist. Table slides with light weight bearing with facilitation of wrist extension sliding in neutral on tissue on table.  Patient demonstrates increase extension to neutral with stabilization of forearm also attempt with 1 pound weight.   Exercises for weight tower this date for upright row for 1 set of 10# and one set of 15#, and one set of 20#, 10-12 reps each while wearing wrist brace for support. (Weight increased this date) Lat pull down for 1 set of 20#, 1 set 25# (weight increased this date)  Elbow  extension in pronation and neutral, blue Thera-Band, progressed to 3 sets of 10-12 shoulder horizontal abduction against the wall with elbow extension 90 degrees height pulling apart blue Thera-Band 2 sets of 10-12, progress to 3 sets Standing against the wall D1 pattern from the right hip overhead upgraded to green Thera-Band 3 sets of 12 reps twice a  day Pronation/supination  Radial nerve glides  Pt to continue with wearing wrist braces to avoid over stretching of extensors.  PROM for elbow extension at home prior to theraband HEP  CMC neoprene to try at nighttime for more comfort but also assisting thumb out of the palm. wrist and thumb wrap to try during the day for comfort.  PATIENT EDUCATION: Education details: Findings of evaluation and home program as well as splint wearing Person educated: Patient Education method: Explanation, Demonstration, Tactile cues, Verbal cues, and Handouts Education comprehension: verbalized understanding, returned demonstration, verbal cues required, tactile cues required, and needs further education  GOALS: Goals reviewed with patient? Yes  SHORT TERM GOALS: Target date: 4 wks   Patient to be independent and wearing splints correctly to prevent flexion contractures as well as facilitating functional use of left hand Baseline: Patient arrived with wrist splint on but report not really wearing it.  Patient with some discomfort at wrist as well as tightness with wrist extension.  Patient to wear wrist splint when doing elbow and shoulder exercises.  Info provided to order prefab radial nerve splint. Goal status: INITIAL  2.  Patient to be independent in home program to initiate passive range of motion and active assisted range of motion to left shoulder to prevent stiffness until next appointment with surgeon Baseline: Patient reports she has been doing active range of motion with shoulder overhead ;not wearing a sling or compression and not really wearing  any more wrist splint or  sleeping with her splint Goal status: INITIAL  LONG TERM GOALS: Target date: 12 wks  Patient to be independent in home program and progression as radial nerve improves to facilitate wrist extension and thumb and digit extension. Baseline: Stiffness in wrist extension.  No wrist extension against gravity.  Partial active assisted range of motion to neutral without gravity on table no digit extension and limited or impaired thumb radial and palmar abduction. Goal status: INITIAL  2.  Left shoulder active range of motion increased to within functional limits to be able to initiate strengthening Baseline: Only passive and active assisted range of motion in supine for shoulder flexion and passive range of motion for shoulder abduction in supine to 90-surgery 05-17-2023 Goal status: INITIAL  3.  Left elbow flexion and extension improve to within functional limits for patient to reach down to feet as well as washing comb hair. Baseline: Elbow extension coming in -50 and during session after heat in supine with active assisted range of motion -38, flexion 130 degrees. Goal status: INITIAL  4.  Upgrade goals as patient is progressing with fracture healing and radial nerve Baseline: Patient only had surgery about 12 days ago. Goal status: INITIAL  5.  Patient to be independent in scar mobilization to increase motion and elbow flexion and extension. Baseline: Steri-Strips still in place on incision over midshaft of humerus Goal status: INITIAL  ASSESSMENT:  CLINICAL IMPRESSION: Patient seen for occupational therapy for left midshaft humerus fracture with ORIF by Dr. Celena patient also has a radial nerve injury from fall.  Patient had a visit with Dr. Celena yesterday x-ray showed good healing and got a verbal order to initiate strengthening. Elbow extension improve greatly - Reinforce  patient again to continue with  elbow extension stretches and AROM, prior to doing home  exercises for strengthening.   Pt able to do light weightbearing for forearm flexor stretch prior to attempts of wrist extension and digit extension.  Wrist extension to neutral without gravity,  then against gravity with improvements.  Facilitation of partial wrist extension sliding on table even with a 1 pound cuff weight.  .Reinforce strengthening exercises as well as continuing to do several times during the day wrist extension with and without gravity as well as thumb PA and RA. Pt upgraded to blue theraband recently and green for D1-D2 pattern. Resistance exercises with use of exercise tower for UE,  use of wrist support, monitoring of wrist positioning during exercises and cues for form.  Pt tolerating increased sets and resistance of exercises this week.  Patient limited in functional use of left upper extremity because of fracture and radial nerve injury patient with decreased range of motion and strength as well as nerve injury.  Patient can benefit from skilled OT services to increase motion increase strength to return to prior level of function.  PERFORMANCE DEFICITS: in functional skills including ADLs, IADLs, coordination, sensation, edema, ROM, strength, pain, flexibility, decreased knowledge of precautions, decreased knowledge of use of DME, and UE functional use,    IMPAIRMENTS: are limiting patient from ADLs, IADLs, rest and sleep, work, play, leisure, and social participation.   COMORBIDITIES: has no other co-morbidities that affects occupational performance. Patient will benefit from skilled OT to address above impairments and improve overall function.  MODIFICATION OR ASSISTANCE TO COMPLETE EVALUATION: Min-Moderate modification of tasks or assist with assess necessary to complete an evaluation.  OT OCCUPATIONAL PROFILE AND HISTORY: Detailed assessment: Review of records and additional review of physical, cognitive, psychosocial history related to current functional  performance.  CLINICAL DECISION MAKING: Moderate - several treatment options, min-mod task modification necessary  REHAB POTENTIAL: Good  EVALUATION COMPLEXITY: Moderate  PLAN: OT FREQUENCY: 1-2x/week  OT DURATION: 12 weeks  PLANNED INTERVENTIONS: 97535 self care/ADL training, 02889 therapeutic exercise, 97140 manual therapy, 97039 fluidotherapy, 97010 moist heat, 97034 contrast bath, scar mobilization, passive range of motion, patient/family education, and DME and/or AE instructions  CONSULTED AND AGREED WITH PLAN OF CARE: Patient  Lamesha Tibbits, OTR/L,CLT 08/03/2023, 5:50 PM

## 2023-08-07 ENCOUNTER — Ambulatory Visit: Payer: BC Managed Care – PPO | Admitting: Occupational Therapy

## 2023-08-07 DIAGNOSIS — L905 Scar conditions and fibrosis of skin: Secondary | ICD-10-CM

## 2023-08-07 DIAGNOSIS — M25622 Stiffness of left elbow, not elsewhere classified: Secondary | ICD-10-CM

## 2023-08-07 DIAGNOSIS — M6281 Muscle weakness (generalized): Secondary | ICD-10-CM

## 2023-08-07 DIAGNOSIS — G5632 Lesion of radial nerve, left upper limb: Secondary | ICD-10-CM | POA: Diagnosis not present

## 2023-08-07 DIAGNOSIS — M25632 Stiffness of left wrist, not elsewhere classified: Secondary | ICD-10-CM

## 2023-08-07 NOTE — Therapy (Signed)
 OUTPATIENT OCCUPATIONAL THERAPY ORTHO TREATMENT  Patient Name: Marissa Harrison MRN: [id number] DOB:21-Apr-1976, 48 y.o., female  PCP: Harvey NP REFERRING PROVIDER: Dr Celena  END OF SESSION:  OT End of Session - 08/07/23 1318     Visit Number 19    Number of Visits 24    Date for OT Re-Evaluation 08/21/23    OT Start Time 1202    OT Stop Time 1247    OT Time Calculation (min) 45 min    Activity Tolerance Patient tolerated treatment well    Behavior During Therapy [facility] for tasks assessed/performed              Past Medical History:  Diagnosis Date   HSV infection    Past Surgical History:  Procedure Laterality Date   BREAST BIOPSY Right 04/13/2017   BENIGN NODULAR ADENOSIS, wing shaped marker   BREAST BIOPSY Left 11/14/2022   Us  Core Bx Ribbon clip path pending   BREAST BIOPSY Left 11/14/2022   US  LT BREAST BX W LOC DEV 1ST LESION IMG BX SPEC US  GUIDE 11/14/2022 [facility]   BREAST CYST ASPIRATION Right 2007   neg   CESAREAN SECTION  1992   ORIF HUMERUS FRACTURE Left 05/17/2023   Procedure: OPEN REDUCTION INTERNAL FIXATION (ORIF) LEFT HUMERUS FRACTURE;  Surgeon: Celena Sharper, MD;  Location: [facility] OR;  Service: Orthopedics;  Laterality: Left;   TUBAL LIGATION  2003   There are no active problems to display for this patient.   ONSET DATE: 05/17/23  REFERRING DIAG: L humerus shaft fx with ORIF and Radial N injury  THERAPY DIAG:  Radial nerve palsy, left  Stiffness of left elbow, not elsewhere classified  Stiffness of left wrist, not elsewhere classified  Muscle weakness (generalized)  Scar condition and fibrosis of skin  Rationale for Evaluation and Treatment: Rehabilitation  SUBJECTIVE:   SUBJECTIVE STATEMENT: Upper arm doing okay.  Still feel the elbow if picking up something heavy.  Nerve pain is mostly in my thumb now.  But I feel my wrist and my fingers are still the same than when you left on vacation.      PERTINENT HISTORY:  OP NOTE DR HANDY  on 05/17/23: PRE-OPERATIVE DIAGNOSIS:   1. LEFT HUMERAL SHAFT FRACTURE 2. RADIAL NERVE INJURY   POST-OPERATIVE DIAGNOSIS:   1. LEFT HUMERAL SHAFT FRACTURE 2. RADIAL NERVE INJURED BUT LARGELY INTACT   PROCEDURE:  Procedure(s): 1. OPEN REDUCTION INTERNAL FIXATION (ORIF) LEFT HUMERAL SHAFT FRACTURE  2. EXPLORATION AND NEUROPLASTY OF RADIAL NERVE      BRIEF SUMMARY AND INDICATIONS FOR PROCEDURE:  Marissa Harrison is a 48 y.o. who sustained fall resulting in displaced transverse humeral shaft fracture and complete radial nerve deficit.   PRECAUTIONS: No AROM shoulder ABD - not even gravity; AAROM and PROM for wrist , elbow and shoulder flexion; NWB ; Radial N injury  WEIGHT BEARING RESTRICTIONS: No  PAIN:  Are you having pain?  Wrist and thumb pain 2-4/10 nerve pain - colder worse FALLS: Has patient fallen in last 6 months? Yes. 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  And alsothis nerve injury.  NEXT MD VISIT: 15th Jan 25 OBJECTIVE:  Note: Objective measures were completed at Evaluation unless otherwise noted.  HAND DOMINANCE: Right  UPPER  EXTREMITY ROM:       Passive ROM Right eval Left eval L 05/31/23 L 06/11/23 L 06/14/23 L 06/18/23 L 06/21/23 L 06/26/23 L 07/10/23 L 07/24/23 L  08/07/23  Shoulder flexion  140 supine 120 PROM supine PROM supine 140 AROM 160 160       Shoulder abduction  PROM 90 supine PROM 90 supine PROM supine 90 95 AROM  155       Shoulder adduction             Shoulder extension     55 55       Shoulder internal rotation             Shoulder external rotation             Elbow flexion  130 AAROM 140 AAROM 150 150 150   150    Elbow extension  -50 coming in - in session -38 AAROM supine after heat -38 coming in - in session -25  -25 coming in -  session -15 to -20 -20 to -25 -20 to -23 -15 to -18  -5 to -15 -8 -8 but after  Exs -6   Wrist flexion  90  80         Wrist extension  On table- without gravity - to neutral with AAROM   AROM without gravity to neutral      Over edge of table AROM -55 and place and hold -45  Over edge of table AROM -55 and place and hold -45 Over edge AROM -42 and place and hold -35 - after flexor stretch -35 and place and hold -25   Wrist ulnar deviation  On table - 30  3- sliding on paper on table          Wrist radial deviation  On table - 20  20- slding on table on paper         Wrist pronation  90  90         Wrist supination  90  90         (Blank rows = not tested)  Active ROM Right eval Left eval L 06/11/23 L 06/21/23 L 07/19/23 L 07/26/23 L 08/07/23  Thumb MCP (0-60)         Thumb IP (0-80)         Thumb Radial abd/add (0-55)  Decrease at [facility]   Decrease [facility]    48 Mostly out of [facility]   Thumb Palmar abd/add (0-45)  Unable to maintain - drop into flexion   Able to keep in PA 6 reps place and hold- but drop in flexion with ADD or after 6 reps  Able to maintain palmar abduction position for 10-12 reps.  Able to initiate rubber band for resistance.  30 Able to do PA but drop in flexion with ADD   Thumb Opposition to Small Finger  Unable         Index MCP (0-90)      70 70 -60  Index PIP (0-100)      80 75   Index DIP (0-70)           Long MCP (0-90)       65 65 -60  Long PIP (0-100)        80   Long DIP (0-70)           Ring MCP (0-90)       70 70 -55  Ring PIP (0-100)  90 80   Ring DIP (0-70)           Little MCP (0-90)         -40  Little PIP (0-100)           Little DIP (0-70)           Pt can make fist - unable to extend digits- if [facility] support at [facility] - can extend PIP's  at eval    UPPER EXTREMITY MMT:     MMT Right eval Left eval  Shoulder flexion    Shoulder abduction    Shoulder adduction    Shoulder extension    Shoulder internal rotation    Shoulder external rotation     Middle trapezius    Lower trapezius    Elbow flexion    Elbow extension    Wrist flexion    Wrist extension    Wrist ulnar deviation    Wrist radial deviation    Wrist pronation    Wrist supination    (Blank rows = not tested)  HAND FUNCTION: 07/19/23:   Grip strength R 65#, left 25# Lateral pinch R 15#, L5# 3 point pinch Right 15#, L3# 2 point pinch R 10#, L1#   COORDINATION: Radial N injury - impaired   SENSATION: Numbness and burning pain reported posterior  elbow over dorsal forearm into hand /thumb  EDEMA: improved greatly  COGNITION: Overall cognitive status: Within functional limits for tasks assessed  TODAY'S TREATMENT:                                                                                                                              DATE: 08/06/22   Moist heat : [place] for 6 minutes to increase range of motion, performed prior to forearm flexor stretch stretch prior to facilitation of wrist extension and digit extension.  Manual Therapy: Carpal and metacarpal spreads performed, manual stretch to web space. Soft tissue massage to webspace focusing on facilitation of  thumb extension and thumb ABD.  Pt continues to work on scar massage at home for bicep area, - some scar adhesion limiting endrange extension.  Kinesiotape done this date with 2 parallels at 30% and 4 across at 100% to wear during the day if any itching occur can remove otherwise remove it tomorrow afternoon.  Theraband exercises:  Pt seen for facilitation of wrist extension with place and hold of wrist , various positions.  Finger extension with wrist blocked in neutral as well as in slight flexion.   Wrist extension active range of motion over edge table with guiding from therapist to finish range As well as placing hold. Wrist flexor stretches done several times in between reps and sets.  With increase wrist and digit extension.   Facilitate wrist extension in neutral sliding on table  with 1 pound cuff weight.  Was able to get to neutral.      Attempted to do digit extension placed  on hold over edge of table. Patient do show increased wrist extension and digit extension compared to December. With attempts of facilitating digit extension over red roller patient was able actually to facilitate wrist extension on table. Able to add rubber band for thumb palmar abduction but focusing on keeping it elevated during adduction. Wrist splint on with both thumb palmar radial abduction Was able to do thumb radial abduction in the splint on table with glove to decrease friction on table Reinforced with patient to do home exercises for wrist and digit extension as well as thumb several times during the day with wrist forearm flexor stretches in between. At least 4-5 times a day.  With Sever flexor stretches in between and reps and sets about 6 7 reps. Quality more than quantity   Pt to continue with wearing wrist brace to avoid over stretching of extensors.  PROM for elbow extension at home prior to theraband HEP   PATIENT EDUCATION: Education details: Findings of evaluation and home program as well as splint wearing Person educated: Patient Education method: Explanation, Demonstration, Tactile cues, Verbal cues, and Handouts Education comprehension: verbalized understanding, returned demonstration, verbal cues required, tactile cues required, and needs further education  GOALS: Goals reviewed with patient? Yes  SHORT TERM GOALS: Target date: 4 wks   Patient to be independent and wearing splints correctly to prevent flexion contractures as well as facilitating functional use of left hand Baseline: Patient arrived with wrist splint on but report not really wearing it.  Patient with some discomfort at wrist as well as tightness with wrist extension.  Patient to wear wrist splint when doing elbow and shoulder exercises.  Info provided to order prefab radial nerve splint. Goal status:  INITIAL  2.  Patient to be independent in home program to initiate passive range of motion and active assisted range of motion to left shoulder to prevent stiffness until next appointment with surgeon Baseline: Patient reports she has been doing active range of motion with shoulder overhead ;not wearing a sling or compression and not really wearing any more wrist splint or  sleeping with her splint Goal status: INITIAL  LONG TERM GOALS: Target date: 12 wks  Patient to be independent in home program and progression as radial nerve improves to facilitate wrist extension and thumb and digit extension. Baseline: Stiffness in wrist extension.  No wrist extension against gravity.  Partial active assisted range of motion to neutral without gravity on table no digit extension and limited or impaired thumb radial and palmar abduction. Goal status: INITIAL  2.  Left shoulder active range of motion increased to within functional limits to be able to initiate strengthening Baseline: Only passive and active assisted range of motion in supine for shoulder flexion and passive range of motion for shoulder abduction in supine to 90-surgery 05-17-2023 Goal status: INITIAL  3.  Left elbow flexion and extension improve to within functional limits for patient to reach down to feet as well as washing comb hair. Baseline: Elbow extension coming in -50 and during session after heat in supine with active assisted range of motion -38, flexion 130 degrees. Goal status: INITIAL  4.  Upgrade goals as patient is progressing with fracture healing and radial nerve Baseline: Patient only had surgery about 12 days ago. Goal status: INITIAL  5.  Patient to be independent in scar mobilization to increase motion and elbow flexion and extension. Baseline: Steri-Strips still in place on incision over midshaft of humerus Goal status: INITIAL  ASSESSMENT:  CLINICAL IMPRESSION: Patient seen for occupational therapy for left  midshaft humerus fracture with ORIF by Dr. Celena patient also has a radial nerve injury from fall.   Elbow extension improve greatly - Reinforce  patient again to continue with  elbow extension stretches and scar massage.  Did do some kinesiotaping for patient to use during the day.  Focus this date mostly on forearm flexor stretches in between repeated facilitation of wrist and digit extension including thumb in various positions.  Reinforced with patient to do several times during the day while at work.  4-5 times a day with quality more than quantity and motion.  Flexor stretches in between with low reps 6-7.  Tinel of radial nerve at dorsal wrist this date.  Nerve pain mostly in the thumb.  Patient limited in functional use of left upper extremity because of fracture and radial nerve injury patient with decreased range of motion and strength as well as nerve injury.  Patient can benefit from skilled OT services to increase motion increase strength to return to prior level of function.  PERFORMANCE DEFICITS: in functional skills including ADLs, IADLs, coordination, sensation, edema, ROM, strength, pain, flexibility, decreased knowledge of precautions, decreased knowledge of use of DME, and UE functional use,    IMPAIRMENTS: are limiting patient from ADLs, IADLs, rest and sleep, work, play, leisure, and social participation.   COMORBIDITIES: has no other co-morbidities that affects occupational performance. Patient will benefit from skilled OT to address above impairments and improve overall function.  MODIFICATION OR ASSISTANCE TO COMPLETE EVALUATION: Min-Moderate modification of tasks or assist with assess necessary to complete an evaluation.  OT OCCUPATIONAL PROFILE AND HISTORY: Detailed assessment: Review of records and additional review of physical, cognitive, psychosocial history related to current functional performance.  CLINICAL DECISION MAKING: Moderate - several treatment options, min-mod  task modification necessary  REHAB POTENTIAL: Good  EVALUATION COMPLEXITY: Moderate  PLAN: OT FREQUENCY: 1-2x/week  OT DURATION: 12 weeks  PLANNED INTERVENTIONS: 97535 self care/ADL training, 02889 therapeutic exercise, 97140 manual therapy, 97039 fluidotherapy, 97010 moist heat, 97034 contrast bath, scar mobilization, passive range of motion, patient/family education, and DME and/or AE instructions  CONSULTED AND AGREED WITH PLAN OF CARE: Patient  Ancel Peters, OTR/L,CLT 08/07/2023, 1:19 PM

## 2023-08-09 ENCOUNTER — Ambulatory Visit: Payer: BC Managed Care – PPO | Admitting: Occupational Therapy

## 2023-08-10 ENCOUNTER — Ambulatory Visit: Payer: BC Managed Care – PPO | Admitting: Occupational Therapy

## 2023-08-10 DIAGNOSIS — M6281 Muscle weakness (generalized): Secondary | ICD-10-CM

## 2023-08-10 DIAGNOSIS — L905 Scar conditions and fibrosis of skin: Secondary | ICD-10-CM

## 2023-08-10 DIAGNOSIS — M25622 Stiffness of left elbow, not elsewhere classified: Secondary | ICD-10-CM

## 2023-08-10 DIAGNOSIS — M25632 Stiffness of left wrist, not elsewhere classified: Secondary | ICD-10-CM

## 2023-08-10 DIAGNOSIS — M25612 Stiffness of left shoulder, not elsewhere classified: Secondary | ICD-10-CM

## 2023-08-10 DIAGNOSIS — G5632 Lesion of radial nerve, left upper limb: Secondary | ICD-10-CM | POA: Diagnosis not present

## 2023-08-10 NOTE — Therapy (Signed)
 OUTPATIENT OCCUPATIONAL THERAPY ORTHO TREATMENT/ 20 visits   Patient Name: Marissa Harrison MRN: [id number] DOB:09-14-1975, 48 y.o., female  PCP: Harvey NP REFERRING PROVIDER: Dr Celena  END OF SESSION:  OT End of Session - 08/10/23 1008     Visit Number 20    Number of Visits 24    Date for OT Re-Evaluation 08/21/23    OT Start Time 0900    OT Stop Time 0946    OT Time Calculation (min) 46 min    Activity Tolerance Patient tolerated treatment well    Behavior During Therapy [facility] for tasks assessed/performed              Past Medical History:  Diagnosis Date   HSV infection    Past Surgical History:  Procedure Laterality Date   BREAST BIOPSY Right 04/13/2017   BENIGN NODULAR ADENOSIS, wing shaped marker   BREAST BIOPSY Left 11/14/2022   Us  Core Bx Ribbon clip path pending   BREAST BIOPSY Left 11/14/2022   US  LT BREAST BX W LOC DEV 1ST LESION IMG BX SPEC US  GUIDE 11/14/2022 [facility]   BREAST CYST ASPIRATION Right 2007   neg   CESAREAN SECTION  1992   ORIF HUMERUS FRACTURE Left 05/17/2023   Procedure: OPEN REDUCTION INTERNAL FIXATION (ORIF) LEFT HUMERUS FRACTURE;  Surgeon: Celena Sharper, MD;  Location: [facility] OR;  Service: Orthopedics;  Laterality: Left;   TUBAL LIGATION  2003   There are no active problems to display for this patient.   ONSET DATE: 05/17/23  REFERRING DIAG: L humerus shaft fx with ORIF and Radial N injury  THERAPY DIAG:  Radial nerve palsy, left  Stiffness of left elbow, not elsewhere classified  Stiffness of left wrist, not elsewhere classified  Muscle weakness (generalized)  Scar condition and fibrosis of skin  Stiffness of left shoulder, not elsewhere classified  Rationale for Evaluation and Treatment: Rehabilitation  SUBJECTIVE:   SUBJECTIVE STATEMENT: I tried to do the exercises during the day.  I could get it 3 times.  I took the type of my scar last night.  I can get this up on my jacket now but did not try the button on my  jean yet  PERTINENT HISTORY:  OP NOTE DR HANDY on 05/17/23: PRE-OPERATIVE DIAGNOSIS:   1. LEFT HUMERAL SHAFT FRACTURE 2. RADIAL NERVE INJURY   POST-OPERATIVE DIAGNOSIS:   1. LEFT HUMERAL SHAFT FRACTURE 2. RADIAL NERVE INJURED BUT LARGELY INTACT   PROCEDURE:  Procedure(s): 1. OPEN REDUCTION INTERNAL FIXATION (ORIF) LEFT HUMERAL SHAFT FRACTURE  2. EXPLORATION AND NEUROPLASTY OF RADIAL NERVE      BRIEF SUMMARY AND INDICATIONS FOR PROCEDURE:  Marissa Harrison is a 48 y.o. who sustained fall resulting in displaced transverse humeral shaft fracture and complete radial nerve deficit.   PRECAUTIONS: No AROM shoulder ABD - not even gravity; AAROM and PROM for wrist , elbow and shoulder flexion; NWB ; Radial N injury  WEIGHT BEARING RESTRICTIONS: No  PAIN:  Are you having pain?  Wrist and thumb pain 2-4/10 nerve pain - colder worse FALLS: Has patient fallen in last 6 months? Yes. 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  And alsothis nerve injury.  NEXT MD VISIT: 15th Jan 25 OBJECTIVE:  Note: Objective measures were completed  at Evaluation unless otherwise noted.  HAND DOMINANCE: Right  UPPER EXTREMITY ROM:       Passive ROM Right eval Left eval L 05/31/23 L 06/11/23 L 06/14/23 L 06/18/23 L 06/21/23 L 06/26/23 L 07/10/23 L 07/24/23 L  08/07/23  Shoulder flexion  140 supine 120 PROM supine PROM supine 140 AROM 160 160       Shoulder abduction  PROM 90 supine PROM 90 supine PROM supine 90 95 AROM  155       Shoulder adduction             Shoulder extension     55 55       Shoulder internal rotation             Shoulder external rotation             Elbow flexion  130 AAROM 140 AAROM 150 150 150   150    Elbow extension  -50 coming in - in session -38 AAROM supine after heat  -38 coming in - in session -25  -25 coming in - session -15 to -20 -20 to -25 -20 to -23 -15 to -18  -5 to -15 -8 -8 but after  Exs -6   Wrist flexion  90  80         Wrist extension  On table- without gravity - to neutral with AAROM   AROM without gravity to neutral      Over edge of table AROM -55 and place and hold -45  Over edge of table AROM -55 and place and hold -45 Over edge AROM -42 and place and hold -35 - after flexor stretch -35 and place and hold -25   Wrist ulnar deviation  On table - 30  3- sliding on paper on table          Wrist radial deviation  On table - 20  20- slding on table on paper         Wrist pronation  90  90         Wrist supination  90  90         (Blank rows = not tested)  Active ROM Right eval Left eval L 06/11/23 L 06/21/23 L 07/19/23 L 07/26/23 L 08/07/23  Thumb MCP (0-60)         Thumb IP (0-80)         Thumb Radial abd/add (0-55)  Decrease at [facility]   Decrease [facility]    48 Mostly out of [facility]   Thumb Palmar abd/add (0-45)  Unable to maintain - drop into flexion   Able to keep in PA 6 reps place and hold- but drop in flexion with ADD or after 6 reps  Able to maintain palmar abduction position for 10-12 reps.  Able to initiate rubber band for resistance.  30 Able to do PA but drop in flexion with ADD   Thumb Opposition to Small Finger  Unable         Index MCP (0-90)      70 70 -60  Index PIP (0-100)      80 75   Index DIP (0-70)           Long MCP (0-90)       65 65 -60  Long PIP (0-100)        80   Long DIP (0-70)           Ring MCP (0-90)  70 70 -55  Ring PIP (0-100)       90 80   Ring DIP (0-70)           Little MCP (0-90)         -40  Little PIP (0-100)           Little DIP (0-70)           Pt can make fist - unable to extend digits- if [facility] support at [facility] - can extend PIP's  at eval    UPPER EXTREMITY MMT:     MMT Right eval Left eval  Shoulder flexion    Shoulder abduction    Shoulder adduction    Shoulder extension    Shoulder internal  rotation    Shoulder external rotation    Middle trapezius    Lower trapezius    Elbow flexion    Elbow extension    Wrist flexion    Wrist extension    Wrist ulnar deviation    Wrist radial deviation    Wrist pronation    Wrist supination    (Blank rows = not tested)  HAND FUNCTION: 07/19/23:   Grip strength R 65#, left 25# Lateral pinch R 15#, L5# 3 point pinch Right 15#, L3# 2 point pinch R 10#, L1#   COORDINATION: Radial N injury - impaired   SENSATION: Tinel at dorsal wrist -and nerve pain mostly in thumb now   EDEMA: improved greatly  COGNITION: Overall cognitive status: Within functional limits for tasks assessed  TODAY'S TREATMENT:                                                                                                                              DATE: 08/09/22   E-stim : 2  2 x 2 electrodes on extensors  dorsal wrist and forearm for wrist extention -done place and hold 10 sec / on and off - and AAROM rolling over red roller  Theraband exercises:  Pt seen for facilitation of wrist extension with place and hold of wrist , various positions.  Finger extension with wrist blocked in neutral as well as in slight flexion.   Wrist extension active range of motion over edge table with guiding from therapist to finish range As well as placing hold. Done all of above there ex - with kinesiotape from dorsal wrist to proximal ext mass- at 80% pull -  With place and hold could do -15 to -18 extention  Pt ed on doing at home during day and use of HEP during day 3 session  And taping   Wrist flexor stretches done several times in between reps and sets.  With increase wrist and digit extension.   Facilitate wrist extension in neutral sliding on table with 1 pound cuff weight.  Was able to get to neutral.      Attempted to do digit extension placed on hold over edge of table. Patient do show  increased wrist extension and digit extension compared to December. With  attempts of facilitating digit extension over red roller patient was able actually to facilitate wrist extension on table. Able to add rubber band for thumb palmar abduction but focusing on keeping it elevated during adduction. Wrist splint on with both thumb palmar radial abduction Was able to do thumb radial abduction in the splint on table with glove to decrease friction on table Reinforced with patient to do home exercises for wrist and digit extension as well as thumb several times during the day with wrist forearm flexor stretches in between. At least 4-5 times a day.  With Sever flexor stretches in between and reps and sets about 6 7 reps. Quality more than quantity   Pt to continue with wearing wrist brace to avoid over stretching of extensors.  PROM for elbow extension at home prior to theraband HEP   PATIENT EDUCATION: Education details: Findings of evaluation and home program as well as splint wearing Person educated: Patient Education method: Explanation, Demonstration, Tactile cues, Verbal cues, and Handouts Education comprehension: verbalized understanding, returned demonstration, verbal cues required, tactile cues required, and needs further education  GOALS: Goals reviewed with patient? Yes  SHORT TERM GOALS: Target date: 4 wks   Patient to be independent and wearing splints correctly to prevent flexion contractures as well as facilitating functional use of left hand Baseline: Patient arrived with wrist splint on but report not really wearing it.  Patient with some discomfort at wrist as well as tightness with wrist extension.  Patient to wear wrist splint when doing elbow and shoulder exercises.  Info provided to order prefab radial nerve splint. Goal status: INITIAL  2.  Patient to be independent in home program to initiate passive range of motion and active assisted range of motion to left shoulder to prevent stiffness until next appointment with surgeon Baseline:  Patient reports she has been doing active range of motion with shoulder overhead ;not wearing a sling or compression and not really wearing any more wrist splint or  sleeping with her splint Goal status: INITIAL  LONG TERM GOALS: Target date: 12 wks  Patient to be independent in home program and progression as radial nerve improves to facilitate wrist extension and thumb and digit extension. Baseline: Stiffness in wrist extension.  No wrist extension against gravity.  Partial active assisted range of motion to neutral without gravity on table no digit extension and limited or impaired thumb radial and palmar abduction. Goal status: INITIAL  2.  Left shoulder active range of motion increased to within functional limits to be able to initiate strengthening Baseline: Only passive and active assisted range of motion in supine for shoulder flexion and passive range of motion for shoulder abduction in supine to 90-surgery 05-17-2023 Goal status: INITIAL  3.  Left elbow flexion and extension improve to within functional limits for patient to reach down to feet as well as washing comb hair. Baseline: Elbow extension coming in -50 and during session after heat in supine with active assisted range of motion -38, flexion 130 degrees. Goal status: INITIAL  4.  Upgrade goals as patient is progressing with fracture healing and radial nerve Baseline: Patient only had surgery about 12 days ago. Goal status: INITIAL  5.  Patient to be independent in scar mobilization to increase motion and elbow flexion and extension. Baseline: Steri-Strips still in place on incision over midshaft of humerus Goal status: INITIAL  ASSESSMENT:  CLINICAL IMPRESSION: Patient seen for occupational therapy  for left midshaft humerus fracture with ORIF by Dr. Celena patient also has a radial nerve injury from fall.   Elbow extension improve greatly - Reinforce  patient again to continue with  elbow extension stretches and scar  massage.  Did do some kinesiotaping for patient to use during the day for scar mobs. Focus shifting to radial Nerve - for wrist , thumb and digits extention that is still greatly impaired- as well as end range strengthening for elbow extention -and conditioning of L UE -  Reinforced with patient to do several times during the day while at work.  4-5 times a day with quality more than quantity and motion.  Flexor stretches in between with low reps 6-7.  Tinel of radial nerve at dorsal wrist this date.  Nerve pain mostly in the thumb.  Patient limited in functional use of left upper extremity because of fracture and radial nerve injury patient with decreased range of motion and strength as well as nerve injury.  Patient can benefit from skilled OT services to increase motion increase strength to return to prior level of function.  PERFORMANCE DEFICITS: in functional skills including ADLs, IADLs, coordination, sensation, edema, ROM, strength, pain, flexibility, decreased knowledge of precautions, decreased knowledge of use of DME, and UE functional use,    IMPAIRMENTS: are limiting patient from ADLs, IADLs, rest and sleep, work, play, leisure, and social participation.   COMORBIDITIES: has no other co-morbidities that affects occupational performance. Patient will benefit from skilled OT to address above impairments and improve overall function.  MODIFICATION OR ASSISTANCE TO COMPLETE EVALUATION: Min-Moderate modification of tasks or assist with assess necessary to complete an evaluation.  OT OCCUPATIONAL PROFILE AND HISTORY: Detailed assessment: Review of records and additional review of physical, cognitive, psychosocial history related to current functional performance.  CLINICAL DECISION MAKING: Moderate - several treatment options, min-mod task modification necessary  REHAB POTENTIAL: Good  EVALUATION COMPLEXITY: Moderate  PLAN: OT FREQUENCY: 1-2x/week  OT DURATION: 12 weeks  PLANNED  INTERVENTIONS: 97535 self care/ADL training, 02889 therapeutic exercise, 97140 manual therapy, 97039 fluidotherapy, 97010 moist heat, 97034 contrast bath, scar mobilization, passive range of motion, patient/family education, and DME and/or AE instructions  CONSULTED AND AGREED WITH PLAN OF CARE: Patient  Ancel Peters, OTR/L,CLT 08/10/2023, 10:13 AM

## 2023-08-14 ENCOUNTER — Ambulatory Visit: Payer: BC Managed Care – PPO | Admitting: Occupational Therapy

## 2023-08-14 DIAGNOSIS — L905 Scar conditions and fibrosis of skin: Secondary | ICD-10-CM

## 2023-08-14 DIAGNOSIS — M25622 Stiffness of left elbow, not elsewhere classified: Secondary | ICD-10-CM

## 2023-08-14 DIAGNOSIS — G5632 Lesion of radial nerve, left upper limb: Secondary | ICD-10-CM

## 2023-08-14 DIAGNOSIS — M6281 Muscle weakness (generalized): Secondary | ICD-10-CM

## 2023-08-14 NOTE — Therapy (Signed)
 OUTPATIENT OCCUPATIONAL THERAPY ORTHO TREATMENT/ 20 visits   Patient Name: Marissa Harrison MRN: [id number] DOB:August 26, 1975, 48 y.o., female  PCP: Harvey NP REFERRING PROVIDER: Dr Celena  END OF SESSION:  OT End of Session - 08/14/23 1047     Visit Number 21    Number of Visits 24    Date for OT Re-Evaluation 08/21/23    OT Start Time 0815    OT Stop Time 0858    OT Time Calculation (min) 43 min    Activity Tolerance Patient tolerated treatment well    Behavior During Therapy [facility] for tasks assessed/performed              Past Medical History:  Diagnosis Date   HSV infection    Past Surgical History:  Procedure Laterality Date   BREAST BIOPSY Right 04/13/2017   BENIGN NODULAR ADENOSIS, wing shaped marker   BREAST BIOPSY Left 11/14/2022   Us  Core Bx Ribbon clip path pending   BREAST BIOPSY Left 11/14/2022   US  LT BREAST BX W LOC DEV 1ST LESION IMG BX SPEC US  GUIDE 11/14/2022 [facility]   BREAST CYST ASPIRATION Right 2007   neg   CESAREAN SECTION  1992   ORIF HUMERUS FRACTURE Left 05/17/2023   Procedure: OPEN REDUCTION INTERNAL FIXATION (ORIF) LEFT HUMERUS FRACTURE;  Surgeon: Celena Sharper, MD;  Location: [facility] OR;  Service: Orthopedics;  Laterality: Left;   TUBAL LIGATION  2003   There are no active problems to display for this patient.   ONSET DATE: 05/17/23  REFERRING DIAG: L humerus shaft fx with ORIF and Radial N injury  THERAPY DIAG:  Radial nerve palsy, left  Stiffness of left elbow, not elsewhere classified  Muscle weakness (generalized)  Scar condition and fibrosis of skin  Rationale for Evaluation and Treatment: Rehabilitation  SUBJECTIVE:   SUBJECTIVE STATEMENT: I tried to do the exercises during the day.  But I am always busy  PERTINENT HISTORY:  OP NOTE DR HANDY on 05/17/23: PRE-OPERATIVE DIAGNOSIS:   1. LEFT HUMERAL SHAFT FRACTURE 2. RADIAL NERVE INJURY   POST-OPERATIVE DIAGNOSIS:   1. LEFT HUMERAL SHAFT FRACTURE 2. RADIAL NERVE  INJURED BUT LARGELY INTACT   PROCEDURE:  Procedure(s): 1. OPEN REDUCTION INTERNAL FIXATION (ORIF) LEFT HUMERAL SHAFT FRACTURE  2. EXPLORATION AND NEUROPLASTY OF RADIAL NERVE      BRIEF SUMMARY AND INDICATIONS FOR PROCEDURE:  FRIMET DURFEE is a 48 y.o. who sustained fall resulting in displaced transverse humeral shaft fracture and complete radial nerve deficit.   PRECAUTIONS: No AROM shoulder ABD - not even gravity; AAROM and PROM for wrist , elbow and shoulder flexion; NWB ; Radial N injury  WEIGHT BEARING RESTRICTIONS: No  PAIN:  Are you having pain?  Wrist and thumb pain 2-4/10 nerve pain - colder worse FALLS: Has patient fallen in last 6 months? Yes. 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  And alsothis nerve injury.  NEXT MD VISIT: 15th Jan 25 OBJECTIVE:  Note: Objective measures were completed at Evaluation unless otherwise noted.  HAND DOMINANCE: Right  UPPER EXTREMITY ROM:       Passive ROM Right eval Left eval L 05/31/23 L 06/11/23 L 06/14/23 L 06/18/23 L 06/21/23 L 06/26/23 L 07/10/23 L 07/24/23 L  08/07/23  Shoulder flexion  140 supine 120 PROM supine PROM supine 140 AROM 160 160       Shoulder abduction  PROM 90 supine PROM 90 supine PROM supine 90 95 AROM  155       Shoulder adduction             Shoulder extension     55 55       Shoulder internal rotation             Shoulder external rotation             Elbow flexion  130 AAROM 140 AAROM 150 150 150   150    Elbow extension  -50 coming in - in session -38 AAROM supine after heat -38 coming in - in session -25  -25 coming in - session -15 to -20 -20 to -25 -20 to -23 -15 to -18  -5 to -15 -8 -8 but after  Exs -6   Wrist flexion  90  80         Wrist extension  On table- without gravity - to neutral  with AAROM   AROM without gravity to neutral      Over edge of table AROM -55 and place and hold -45  Over edge of table AROM -55 and place and hold -45 Over edge AROM -42 and place and hold -35 - after flexor stretch -35 and place and hold -25   Wrist ulnar deviation  On table - 30  3- sliding on paper on table          Wrist radial deviation  On table - 20  20- slding on table on paper         Wrist pronation  90  90         Wrist supination  90  90         (Blank rows = not tested)  Active ROM Right eval Left eval L 06/11/23 L 06/21/23 L 07/19/23 L 07/26/23 L 08/07/23  Thumb MCP (0-60)         Thumb IP (0-80)         Thumb Radial abd/add (0-55)  Decrease at [facility]   Decrease [facility]    48 Mostly out of [facility]   Thumb Palmar abd/add (0-45)  Unable to maintain - drop into flexion   Able to keep in PA 6 reps place and hold- but drop in flexion with ADD or after 6 reps  Able to maintain palmar abduction position for 10-12 reps.  Able to initiate rubber band for resistance.  30 Able to do PA but drop in flexion with ADD   Thumb Opposition to Small Finger  Unable         Index MCP (0-90)      70 70 -60  Index PIP (0-100)      80 75   Index DIP (0-70)           Long MCP (0-90)       65 65 -60  Long PIP (0-100)        80   Long DIP (0-70)           Ring MCP (0-90)       70 70 -55  Ring PIP (0-100)       90 80   Ring DIP (0-70)           Little MCP (0-90)         -40  Little  PIP (0-100)           Little DIP (0-70)           Pt can make fist - unable to extend digits- if [facility] support at [facility] - can extend PIP's  at eval    UPPER EXTREMITY MMT:     MMT Right eval Left eval  Shoulder flexion    Shoulder abduction    Shoulder adduction    Shoulder extension    Shoulder internal rotation    Shoulder external rotation    Middle trapezius    Lower trapezius    Elbow flexion    Elbow extension    Wrist flexion    Wrist extension    Wrist ulnar deviation    Wrist radial deviation    Wrist  pronation    Wrist supination    (Blank rows = not tested)  HAND FUNCTION: 07/19/23:   Grip strength R 65#, left 25# Lateral pinch R 15#, L5# 3 point pinch Right 15#, L3# 2 point pinch R 10#, L1#   COORDINATION: Radial N injury - impaired   SENSATION: Tinel at dorsal wrist -and nerve pain mostly in thumb now   EDEMA: improved greatly  COGNITION: Overall cognitive status: Within functional limits for tasks assessed  TODAY'S TREATMENT:                                                                                                                              DATE: 08/13/22  Moist heat  Time: 8 Location: elbow  and wrist Elbow extention stretch with 4 lbs weight with hand in pronation prior to ROM  Manual therapy:  Scar massage done over upper arm scar especially with forearm in pronation.  Traction together with cross friction together with extractor. Scar tight pulling elbow in slight flexion  Theraband exercises:  Facilitate this date wrist extension using Benik neoprene for wrist support with elbow and wrist extension rolling medium therapy ball on wall 20 reps  3 x 1 minute stabilization with facilitation of wrist digit and elbow extension with shoulder at 90 degrees patient need tactile cueing for elbow extension as well as can do 3 circles back-and-forth with a tactile cue in between  Patient can do at home with a basketball  Biodex exercise done bilateral elbow extension at 10 pounds 90 degrees and down thighs 12 reps Single elbow extension 5 pounds down place and hold 2 sets of 10 unable to maintain endrange extension using Benik for wrist extension support  Rowing 25 pounds 2 sets of 15 and chest press 20 pounds 15 reps and then 10 reps fatigue unable to finish reps  quality more than quantity   Pt to continue with wearing wrist brace to avoid over stretching of extensors.  PROM for elbow extension at home prior to theraband HEP  Patient to continue facilitation of  wrist and digit extension exercises at home  PATIENT EDUCATION: Education details: Findings of  evaluation and home program as well as splint wearing Person educated: Patient Education method: Explanation, Demonstration, Tactile cues, Verbal cues, and Handouts Education comprehension: verbalized understanding, returned demonstration, verbal cues required, tactile cues required, and needs further education  GOALS: Goals reviewed with patient? Yes  SHORT TERM GOALS: Target date: 4 wks   Patient to be independent and wearing splints correctly to prevent flexion contractures as well as facilitating functional use of left hand Baseline: Patient arrived with wrist splint on but report not really wearing it.  Patient with some discomfort at wrist as well as tightness with wrist extension.  Patient to wear wrist splint when doing elbow and shoulder exercises.  Info provided to order prefab radial nerve splint. Goal status: INITIAL  2.  Patient to be independent in home program to initiate passive range of motion and active assisted range of motion to left shoulder to prevent stiffness until next appointment with surgeon Baseline: Patient reports she has been doing active range of motion with shoulder overhead ;not wearing a sling or compression and not really wearing any more wrist splint or  sleeping with her splint Goal status: INITIAL  LONG TERM GOALS: Target date: 12 wks  Patient to be independent in home program and progression as radial nerve improves to facilitate wrist extension and thumb and digit extension. Baseline: Stiffness in wrist extension.  No wrist extension against gravity.  Partial active assisted range of motion to neutral without gravity on table no digit extension and limited or impaired thumb radial and palmar abduction. Goal status: INITIAL  2.  Left shoulder active range of motion increased to within functional limits to be able to initiate strengthening Baseline: Only  passive and active assisted range of motion in supine for shoulder flexion and passive range of motion for shoulder abduction in supine to 90-surgery 05-17-2023 Goal status: INITIAL  3.  Left elbow flexion and extension improve to within functional limits for patient to reach down to feet as well as washing comb hair. Baseline: Elbow extension coming in -50 and during session after heat in supine with active assisted range of motion -38, flexion 130 degrees. Goal status: INITIAL  4.  Upgrade goals as patient is progressing with fracture healing and radial nerve Baseline: Patient only had surgery about 12 days ago. Goal status: INITIAL  5.  Patient to be independent in scar mobilization to increase motion and elbow flexion and extension. Baseline: Steri-Strips still in place on incision over midshaft of humerus Goal status: INITIAL  ASSESSMENT:  CLINICAL IMPRESSION: Patient seen for occupational therapy for left midshaft humerus fracture with ORIF by Dr. Celena patient also has a radial nerve injury from fall.   Elbow extension improve greatly - Reinforce  patient again to continue with  elbow extension stretches and scar massage.  This date focused on some scar mobilization to middle scar causing elbow to pull and flexion.  And extended myofascial and scar mobilization incorporating elbow extension with facilitation of wrist and digit extension today in gym - Last 3 session prior to today focused on radial Nerve - for wrist , thumb and digits extention that is still greatly impaired- as well as end range strengthening for elbow extention -and conditioning of L UE -  Reinforced with patient to do several times during the day while at work.  4-5 times a day with quality more than quantity and motion.  Flexor stretches in between with low reps 6-7.  Tinel of radial nerve at dorsal wrist this date.  Nerve pain mostly in the thumb.  Patient limited in functional use of left upper extremity because of  fracture and radial nerve injury patient with decreased range of motion and strength as well as nerve injury.  Patient can benefit from skilled OT services to increase motion increase strength to return to prior level of function.  PERFORMANCE DEFICITS: in functional skills including ADLs, IADLs, coordination, sensation, edema, ROM, strength, pain, flexibility, decreased knowledge of precautions, decreased knowledge of use of DME, and UE functional use,    IMPAIRMENTS: are limiting patient from ADLs, IADLs, rest and sleep, work, play, leisure, and social participation.   COMORBIDITIES: has no other co-morbidities that affects occupational performance. Patient will benefit from skilled OT to address above impairments and improve overall function.  MODIFICATION OR ASSISTANCE TO COMPLETE EVALUATION: Min-Moderate modification of tasks or assist with assess necessary to complete an evaluation.  OT OCCUPATIONAL PROFILE AND HISTORY: Detailed assessment: Review of records and additional review of physical, cognitive, psychosocial history related to current functional performance.  CLINICAL DECISION MAKING: Moderate - several treatment options, min-mod task modification necessary  REHAB POTENTIAL: Good  EVALUATION COMPLEXITY: Moderate  PLAN: OT FREQUENCY: 1-2x/week  OT DURATION: 12 weeks  PLANNED INTERVENTIONS: 97535 self care/ADL training, 02889 therapeutic exercise, 97140 manual therapy, 97039 fluidotherapy, 97010 moist heat, 97034 contrast bath, scar mobilization, passive range of motion, patient/family education, and DME and/or AE instructions  CONSULTED AND AGREED WITH PLAN OF CARE: Patient  Ancel Peters, OTR/L,CLT 08/14/2023, 10:49 AM

## 2023-08-16 ENCOUNTER — Ambulatory Visit: Payer: BC Managed Care – PPO | Admitting: Occupational Therapy

## 2023-08-16 DIAGNOSIS — M6281 Muscle weakness (generalized): Secondary | ICD-10-CM

## 2023-08-16 DIAGNOSIS — G5632 Lesion of radial nerve, left upper limb: Secondary | ICD-10-CM | POA: Diagnosis not present

## 2023-08-16 DIAGNOSIS — M25622 Stiffness of left elbow, not elsewhere classified: Secondary | ICD-10-CM

## 2023-08-16 DIAGNOSIS — L905 Scar conditions and fibrosis of skin: Secondary | ICD-10-CM

## 2023-08-16 NOTE — Therapy (Signed)
OUTPATIENT OCCUPATIONAL THERAPY ORTHO TREATMENT  Patient Name: Marissa Harrison MRN: [id number] DOB:Dec 20, 1975, 48 y.o., female  PCP: Darrick Penna NP REFERRING PROVIDER: Dr Carola Frost  END OF SESSION:  OT End of Session - 08/16/23 1910     Visit Number 22    Number of Visits 24    Date for OT Re-Evaluation 08/21/23    OT Start Time 1532    OT Stop Time 1625    OT Time Calculation (min) 53 min    Activity Tolerance Patient tolerated treatment well    Behavior During Therapy [facility] for tasks assessed/performed              Past Medical History:  Diagnosis Date   HSV infection    Past Surgical History:  Procedure Laterality Date   BREAST BIOPSY Right 04/13/2017   BENIGN NODULAR ADENOSIS, wing shaped marker   BREAST BIOPSY Left 11/14/2022   [place] Core Bx Ribbon clip path pending   BREAST BIOPSY Left 11/14/2022   [place] LT BREAST BX W LOC DEV 1ST LESION IMG BX SPEC US GUIDE 11/14/2022 [facility]   BREAST CYST ASPIRATION Right 2007   neg   CESAREAN SECTION  1992   ORIF HUMERUS FRACTURE Left 05/17/2023   Procedure: OPEN REDUCTION INTERNAL FIXATION (ORIF) LEFT HUMERUS FRACTURE;  Surgeon: Myrene Galas, MD;  Location: [facility] OR;  Service: Orthopedics;  Laterality: Left;   TUBAL LIGATION  2003   There are no active problems to display for this patient.   ONSET DATE: 05/17/23  REFERRING DIAG: L humerus shaft fx with ORIF and Radial N injury  THERAPY DIAG:  Radial nerve palsy, left  Stiffness of left elbow, not elsewhere classified  Muscle weakness (generalized)  Scar condition and fibrosis of skin  Rationale for Evaluation and Treatment: Rehabilitation  SUBJECTIVE:   SUBJECTIVE STATEMENT: I was so busy today I did not do any exercises.  I feel like I can tell I am getting little stronger my hand.  PERTINENT HISTORY:  OP NOTE DR HANDY on 05/17/23: PRE-OPERATIVE DIAGNOSIS:   1. LEFT HUMERAL SHAFT FRACTURE 2. RADIAL NERVE INJURY   POST-OPERATIVE DIAGNOSIS:   1. LEFT HUMERAL  SHAFT FRACTURE 2. RADIAL NERVE INJURED BUT LARGELY INTACT   PROCEDURE:  Procedure(s): 1. OPEN REDUCTION INTERNAL FIXATION (ORIF) LEFT HUMERAL SHAFT FRACTURE  2. EXPLORATION AND NEUROPLASTY OF RADIAL NERVE      BRIEF SUMMARY AND INDICATIONS FOR PROCEDURE:  BREONCA ANNETT is a 48 y.o. who sustained fall resulting in displaced transverse humeral shaft fracture and complete radial nerve deficit.   PRECAUTIONS: No AROM shoulder ABD - not even gravity; AAROM and PROM for wrist , elbow and shoulder flexion; NWB ; Radial N injury  WEIGHT BEARING RESTRICTIONS: No  PAIN:  Are you having pain?  Wrist and thumb pain 2-4/10 nerve pain - colder worse FALLS: Has patient fallen in last 6 months? Yes. 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  And alsothis nerve injury.  NEXT MD VISIT: 15th Jan 25 OBJECTIVE:  Note: Objective measures were completed at Evaluation unless otherwise noted.  HAND DOMINANCE: Right  UPPER EXTREMITY ROM:       Passive ROM Right eval Left eval L 05/31/23 L 06/11/23 L 06/14/23 L 06/18/23 L 06/21/23 L 06/26/23 L 07/10/23 L  07/24/23 L  08/07/23  Shoulder flexion  140 supine 120 PROM supine PROM supine 140 AROM 160 160       Shoulder abduction  PROM 90 supine PROM 90 supine PROM supine 90 95 AROM  155       Shoulder adduction             Shoulder extension     55 55       Shoulder internal rotation             Shoulder external rotation             Elbow flexion  130 AAROM 140 AAROM 150 150 150   150    Elbow extension  -50 coming in - in session -38 AAROM supine after heat -38 coming in - in session -25  -25 coming in - session -15 to -20 -20 to -25 -20 to -23 -15 to -18  -5 to -15 -8 -8 but after  Exs -6   Wrist flexion  90  80         Wrist extension  On table-  without gravity - to neutral with AAROM   AROM without gravity to neutral      Over edge of table AROM -55 and place and hold -45  Over edge of table AROM -55 and place and hold -45 Over edge AROM -42 and place and hold -35 - after flexor stretch -35 and place and hold -25   Wrist ulnar deviation  On table - 30  3- sliding on paper on table          Wrist radial deviation  On table - 20  20- slding on table on paper         Wrist pronation  90  90         Wrist supination  90  90         (Blank rows = not tested)  Active ROM Right eval Left eval L 06/11/23 L 06/21/23 L 07/19/23 L 07/26/23 L 08/07/23  Thumb MCP (0-60)         Thumb IP (0-80)         Thumb Radial abd/add (0-55)  Decrease at [facility]   Decrease [facility]    48 Mostly out of [facility]   Thumb Palmar abd/add (0-45)  Unable to maintain - drop into flexion   Able to keep in PA 6 reps place and hold- but drop in flexion with ADD or after 6 reps  Able to maintain palmar abduction position for 10-12 reps.  Able to initiate rubber band for resistance.  30 Able to do PA but drop in flexion with ADD   Thumb Opposition to Small Finger  Unable         Index MCP (0-90)      70 70 -60  Index PIP (0-100)      80 75   Index DIP (0-70)           Long MCP (0-90)       65 65 -60  Long PIP (0-100)        80   Long DIP (0-70)           Ring MCP (0-90)       70 70 -55  Ring PIP (0-100)       90 80   Ring DIP (0-70)           Little MCP (0-90)         -  40  Little PIP (0-100)           Little DIP (0-70)           Pt can make fist - unable to extend digits- if [facility] support at [facility] - can extend PIP's  at eval    UPPER EXTREMITY MMT:     MMT Right eval Left eval  Shoulder flexion    Shoulder abduction    Shoulder adduction    Shoulder extension    Shoulder internal rotation    Shoulder external rotation    Middle trapezius    Lower trapezius    Elbow flexion    Elbow extension    Wrist flexion    Wrist extension    Wrist ulnar deviation    Wrist  radial deviation    Wrist pronation    Wrist supination    (Blank rows = not tested)  HAND FUNCTION: 07/19/23:   Grip strength R 65#, left 25# Lateral pinch R 15#, L5# 3 point pinch Right 15#, L3# 2 point pinch R 10#, L1#  08/16/23:   Grip strength R 65#, left 32# resting on thigh and support wrist,Lateral pinch R 15#, L7#; wrist support; 3 point pinch Right 15#, L8# wrist support  COORDINATION: Radial N injury - impaired   SENSATION: Tinel at dorsal wrist -and nerve pain mostly in thumb now   EDEMA: improved greatly  COGNITION: Overall cognitive status: Within functional limits for tasks assessed  TODAY'S TREATMENT:                                                                                                                              DATE: 08/16/23  Moist heat  Time: 8 Location: elbow  and wrist Elbow extention stretch with 4 lbs weight with hand in pronation prior to ROM  Manual therapy:  Scar massage done over upper arm scar especially with forearm in pronation.  Traction together with cross friction together with extractor. Scar tight pulling elbow in slight flexion  Theraband exercises:  Facilitate this date wrist extension with elbow and wrist extension  and shoulder 90 degrees flexion  Against wall pushing into OT hand - activate elbow end range extention - 3-5 sec  Same position - pushing into 1 kg ball on wall - activate end range elbow extention 2 x 12 reps Need more than 50% t/c and v/c   Patient can do at home with a basketball  Biodex exercise done chest press 15 lbs focus on end range elbow extention and 50/50 pressure L and R this date  2x 15 reps   50% t/c for elboe extention end range   Pt to continue with wearing wrist brace to avoid over stretching of extensors.  Facilitate and provide wrist extension stretch-forearm flexor stretch with attempts of placing hold and active range of motion of wrist extension over edge of table.  Facilitate 4 x 8  reps. 1 pound weight facilitating wrist  extension without gravity sliding on table on paper towel OT-2 patient doing 1 with assist 15 reps 2 sets Repeated with 1 pound weight circular around palm with slight finger flexion to facilitate wrist extension again patient able to get to neutral and this date actually 5 degrees past neutral with forearm blocked by OT 6 x 5 reps with wrist forearm flexor stretches in between. Reinforced again for patient to do this several times a day sessions PATIENT EDUCATION: Education details: Findings of evaluation and home program as well as splint wearing Person educated: Patient Education method: Explanation, Demonstration, Tactile cues, Verbal cues, and Handouts Education comprehension: verbalized understanding, returned demonstration, verbal cues required, tactile cues required, and needs further education  GOALS: Goals reviewed with patient? Yes  SHORT TERM GOALS: Target date: 4 wks   Patient to be independent and wearing splints correctly to prevent flexion contractures as well as facilitating functional use of left hand Baseline: Patient arrived with wrist splint on but report not really wearing it.  Patient with some discomfort at wrist as well as tightness with wrist extension.  Patient to wear wrist splint when doing elbow and shoulder exercises.  Info provided to order prefab radial nerve splint. Goal status: INITIAL  2.  Patient to be independent in home program to initiate passive range of motion and active assisted range of motion to left shoulder to prevent stiffness until next appointment with surgeon Baseline: Patient reports she has been doing active range of motion with shoulder overhead ;not wearing a sling or compression and not really wearing any more wrist splint or  sleeping with her splint Goal status: INITIAL  LONG TERM GOALS: Target date: 12 wks  Patient to be independent in home program and progression as radial nerve improves to  facilitate wrist extension and thumb and digit extension. Baseline: Stiffness in wrist extension.  No wrist extension against gravity.  Partial active assisted range of motion to neutral without gravity on table no digit extension and limited or impaired thumb radial and palmar abduction. Goal status: INITIAL  2.  Left shoulder active range of motion increased to within functional limits to be able to initiate strengthening Baseline: Only passive and active assisted range of motion in supine for shoulder flexion and passive range of motion for shoulder abduction in supine to 90-surgery 05-17-2023 Goal status: INITIAL  3.  Left elbow flexion and extension improve to within functional limits for patient to reach down to feet as well as washing comb hair. Baseline: Elbow extension coming in -50 and during session after heat in supine with active assisted range of motion -38, flexion 130 degrees. Goal status: INITIAL  4.  Upgrade goals as patient is progressing with fracture healing and radial nerve Baseline: Patient only had surgery about 12 days ago. Goal status: INITIAL  5.  Patient to be independent in scar mobilization to increase motion and elbow flexion and extension. Baseline: Steri-Strips still in place on incision over midshaft of humerus Goal status: INITIAL  ASSESSMENT:  CLINICAL IMPRESSION: Patient seen for occupational therapy for left midshaft humerus fracture with ORIF by Dr. Carola Frost patient also has a radial nerve injury from fall.   Elbow extension improve greatly - Reinforce  patient again to continue with  elbow extension stretches and scar massage.  This date focused on some scar mobilization to middle scar causing elbow to pull and flexion.  And extended myofascial and scar mobilization incorporating elbow extension with facilitation of wrist and digit extension today in gym -  focus today on endrange elbow extension activating tricep against wall as well as against OT.  As well  as chest press.  Need 50 to 75% tactile and verbal cues for endrange activation of extension and tricep.  Patient came in today little bit tighter stiffer in the forearm with less wrist extension.  But after heat and passive range of motion was able to facilitate wrist extension without gravity sliding on table able to do 1 pound weight activating shoulder extensors or wrist extensors.  Reinforced with patient to do several times during the day while at work.  4-5 times a day with quality more than quantity and motion.  Flexor stretches in between with low reps 6-7.  Tinel of radial nerve at dorsal wrist.  Nerve pain mostly in the thumb.  Patient limited in functional use of left upper extremity because of fracture and radial nerve injury patient with decreased range of motion and strength as well as nerve injury.  Patient can benefit from skilled OT services to increase motion increase strength to return to prior level of function.  PERFORMANCE DEFICITS: in functional skills including ADLs, IADLs, coordination, sensation, edema, ROM, strength, pain, flexibility, decreased knowledge of precautions, decreased knowledge of use of DME, and UE functional use,    IMPAIRMENTS: are limiting patient from ADLs, IADLs, rest and sleep, work, play, leisure, and social participation.   COMORBIDITIES: has no other co-morbidities that affects occupational performance. Patient will benefit from skilled OT to address above impairments and improve overall function.  MODIFICATION OR ASSISTANCE TO COMPLETE EVALUATION: Min-Moderate modification of tasks or assist with assess necessary to complete an evaluation.  OT OCCUPATIONAL PROFILE AND HISTORY: Detailed assessment: Review of records and additional review of physical, cognitive, psychosocial history related to current functional performance.  CLINICAL DECISION MAKING: Moderate - several treatment options, min-mod task modification necessary  REHAB POTENTIAL:  Good  EVALUATION COMPLEXITY: Moderate  PLAN: OT FREQUENCY: 1-2x/week  OT DURATION: 12 weeks  PLANNED INTERVENTIONS: 97535 self care/ADL training, 95284 therapeutic exercise, 97140 manual therapy, 97039 fluidotherapy, 97010 moist heat, 97034 contrast bath, scar mobilization, passive range of motion, patient/family education, and DME and/or AE instructions  CONSULTED AND AGREED WITH PLAN OF CARE: Patient  Oletta Cohn, OTR/L,CLT 08/16/2023, 7:12 PM

## 2023-08-20 ENCOUNTER — Ambulatory Visit: Payer: BC Managed Care – PPO | Admitting: Occupational Therapy

## 2023-08-20 DIAGNOSIS — G5632 Lesion of radial nerve, left upper limb: Secondary | ICD-10-CM

## 2023-08-20 DIAGNOSIS — M25622 Stiffness of left elbow, not elsewhere classified: Secondary | ICD-10-CM

## 2023-08-20 DIAGNOSIS — M6281 Muscle weakness (generalized): Secondary | ICD-10-CM

## 2023-08-20 NOTE — Therapy (Signed)
OUTPATIENT OCCUPATIONAL THERAPY ORTHO TREATMENT  Patient Name: Marissa Harrison MRN: [id number] DOB:[birth year], 48 y.o., female  PCP: Darrick Penna NP REFERRING PROVIDER: Dr Carola Frost  END OF SESSION:  OT End of Session - 08/20/23 1513     Visit Number 23    Number of Visits 24    Date for OT Re-Evaluation 08/21/23    OT Start Time 1514    OT Stop Time 1600    OT Time Calculation (min) 46 min    Activity Tolerance Patient tolerated treatment well    Behavior During Therapy [facility] for tasks assessed/performed              Past Medical History:  Diagnosis Date   HSV infection    Past Surgical History:  Procedure Laterality Date   BREAST BIOPSY Right 04/13/2017   BENIGN NODULAR ADENOSIS, wing shaped marker   BREAST BIOPSY Left 11/14/2022   [place] Core Bx Ribbon clip path pending   BREAST BIOPSY Left 11/14/2022   [place] LT BREAST BX W LOC DEV 1ST LESION IMG BX SPEC US GUIDE 11/14/2022 [facility]   BREAST CYST ASPIRATION Right 2007   neg   CESAREAN SECTION  1992   ORIF HUMERUS FRACTURE Left 05/17/2023   Procedure: OPEN REDUCTION INTERNAL FIXATION (ORIF) LEFT HUMERUS FRACTURE;  Surgeon: Myrene Galas, MD;  Location: [facility] OR;  Service: Orthopedics;  Laterality: Left;   TUBAL LIGATION  2003   There are no active problems to display for this patient.   ONSET DATE: 05/17/23  REFERRING DIAG: L humerus shaft fx with ORIF and Radial N injury  THERAPY DIAG:  Radial nerve palsy, left  Stiffness of left elbow, not elsewhere classified  Muscle weakness (generalized)  Rationale for Evaluation and Treatment: Rehabilitation  SUBJECTIVE:   SUBJECTIVE STATEMENT: Look what I can do - pick up the baseball and flip if over- I could fasten my necklace and jeans over the weekend  PERTINENT HISTORY:  OP NOTE DR HANDY on 05/17/23: PRE-OPERATIVE DIAGNOSIS:   1. LEFT HUMERAL SHAFT FRACTURE 2. RADIAL NERVE INJURY   POST-OPERATIVE DIAGNOSIS:   1. LEFT HUMERAL SHAFT FRACTURE 2. RADIAL NERVE  INJURED BUT LARGELY INTACT   PROCEDURE:  Procedure(s): 1. OPEN REDUCTION INTERNAL FIXATION (ORIF) LEFT HUMERAL SHAFT FRACTURE  2. EXPLORATION AND NEUROPLASTY OF RADIAL NERVE      BRIEF SUMMARY AND INDICATIONS FOR PROCEDURE:  Marissa Harrison is a 48 y.o. who sustained fall resulting in displaced transverse humeral shaft fracture and complete radial nerve deficit.   PRECAUTIONS: No AROM shoulder ABD - not even gravity; AAROM and PROM for wrist , elbow and shoulder flexion; NWB ; Radial N injury  WEIGHT BEARING RESTRICTIONS: No  PAIN:  Are you having pain?  Wrist and thumb pain 2-4/10 nerve pain - colder worse FALLS: Has patient fallen in last 6 months? Yes. 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  And alsothis nerve injury.  NEXT MD VISIT: 15th Jan 25 OBJECTIVE:  Note: Objective measures were completed at Evaluation unless otherwise noted.  HAND DOMINANCE: Right  UPPER EXTREMITY ROM:       Passive ROM Right eval Left eval L 05/31/23 L 06/11/23 L 06/14/23 L 06/18/23 L 06/21/23 L 06/26/23 L 07/10/23 L 07/24/23 L  08/07/23  Shoulder flexion  140 supine 120 PROM supine PROM supine 140 AROM 160 160       Shoulder abduction  PROM 90 supine PROM 90 supine PROM supine 90 95 AROM  155       Shoulder adduction             Shoulder extension     55 55       Shoulder internal rotation             Shoulder external rotation             Elbow flexion  130 AAROM 140 AAROM 150 150 150   150    Elbow extension  -50 coming in - in session -38 AAROM supine after heat -38 coming in - in session -25  -25 coming in - session -15 to -20 -20 to -25 -20 to -23 -15 to -18  -5 to -15 -8 -8 but after  Exs -6   Wrist flexion  90  80         Wrist extension  On table- without gravity - to neutral  with AAROM   AROM without gravity to neutral      Over edge of table AROM -55 and place and hold -45  Over edge of table AROM -55 and place and hold -45 Over edge AROM -42 and place and hold -35 - after flexor stretch -35 and place and hold -25   Wrist ulnar deviation  On table - 30  3- sliding on paper on table          Wrist radial deviation  On table - 20  20- slding on table on paper         Wrist pronation  90  90         Wrist supination  90  90         (Blank rows = not tested)  Active ROM Right eval Left eval L 06/11/23 L 06/21/23 L 07/19/23 L 07/26/23 L 08/07/23  Thumb MCP (0-60)         Thumb IP (0-80)         Thumb Radial abd/add (0-55)  Decrease at [facility]   Decrease [facility]    48 Mostly out of [facility]   Thumb Palmar abd/add (0-45)  Unable to maintain - drop into flexion   Able to keep in PA 6 reps place and hold- but drop in flexion with ADD or after 6 reps  Able to maintain palmar abduction position for 10-12 reps.  Able to initiate rubber band for resistance.  30 Able to do PA but drop in flexion with ADD   Thumb Opposition to Small Finger  Unable         Index MCP (0-90)      70 70 -60  Index PIP (0-100)      80 75   Index DIP (0-70)           Long MCP (0-90)       65 65 -60  Long PIP (0-100)        80   Long DIP (0-70)           Ring MCP (0-90)       70 70 -55  Ring PIP (0-100)       90 80   Ring DIP (0-70)           Little MCP (0-90)         -40  Little  PIP (0-100)           Little DIP (0-70)           Pt can make fist - unable to extend digits- if [facility] support at [facility] - can extend PIP's  at eval    UPPER EXTREMITY MMT:     MMT Right eval Left eval  Shoulder flexion    Shoulder abduction    Shoulder adduction    Shoulder extension    Shoulder internal rotation    Shoulder external rotation    Middle trapezius    Lower trapezius    Elbow flexion    Elbow extension    Wrist flexion    Wrist extension    Wrist ulnar deviation    Wrist radial deviation    Wrist  pronation    Wrist supination    (Blank rows = not tested)  HAND FUNCTION: 07/19/23:   Grip strength R 65#, left 25# Lateral pinch R 15#, L5# 3 point pinch Right 15#, L3# 2 point pinch R 10#, L1#  08/16/23:   Grip strength R 65#, left 32# resting on thigh and support wrist,Lateral pinch R 15#, L7#; wrist support; 3 point pinch Right 15#, L8# wrist support  COORDINATION: Radial N injury - impaired   SENSATION: Tinel at dorsal wrist -and nerve pain mostly in thumb now   EDEMA: improved greatly  COGNITION: Overall cognitive status: Within functional limits for tasks assessed  TODAY'S TREATMENT:                                                                                                                              DATE: 08/20/23  Paraffin done this date to elbow and upper arm scar Time: 8 Location: elbow   Elbow extention stretch with 4 lbs weight with hand in pronation prior to ROM  and Manual therapy:  Scar massage done over upper arm scar especially with forearm in pronation.  Traction together with cross friction together with extractor. Scar tight pulling elbow in slight flexion  Therexercises:  Facilitate this date wrist extension in neutral and elbow - stabilization fist on basketball on raise seat - 3 x 30 sec - with tapping all directions for elbow and wrist stabilization Roll weighted 4 lbs ball with close fist into wrist exention 20 reps and  repeat at incline wedge more than 45 degrees 20 reps- faciitate wrist ext Pushing with arm to side into towel, ball and pillow - with loose fist - activate wrist ext neutral and elbow end range - tricep 3 kg ball hands either side - RD, UD with elbow to side  Fatigue into RD - attempt copying weight to use at home- but could not maintain midline Power wedge - with 1 kg ball attempted sup/ pro and RD, UD - rolling ball - but unable to maintain wrist neutral and thumb flex into palm  Baseball - pt able to facilitate thumb PA -  sliding  over ball - with palm on ball To do at home- several times during day As well as pushing into towel, pillow or ball for end range elbow extention and wrist neutral - stabilization  At home  Rolling ball with hand in fist - facilitate wrist extention - can do at home on table in sitting     Pt to continue with wearing wrist brace to avoid over stretching of extensors.  But can alternate into [facility] neoprene hour at time and wrist splint 2 hrs - alternate   PATIENT EDUCATION: Education details: Findings of evaluation and home program as well as splint wearing Person educated: Patient Education method: Explanation, Demonstration, Tactile cues, Verbal cues, and Handouts Education comprehension: verbalized understanding, returned demonstration, verbal cues required, tactile cues required, and needs further education  GOALS: Goals reviewed with patient? Yes  SHORT TERM GOALS: Target date: 4 wks   Patient to be independent and wearing splints correctly to prevent flexion contractures as well as facilitating functional use of left hand Baseline: Patient arrived with wrist splint on but report not really wearing it.  Patient with some discomfort at wrist as well as tightness with wrist extension.  Patient to wear wrist splint when doing elbow and shoulder exercises.  Info provided to order prefab radial nerve splint. Goal status: INITIAL  2.  Patient to be independent in home program to initiate passive range of motion and active assisted range of motion to left shoulder to prevent stiffness until next appointment with surgeon Baseline: Patient reports she has been doing active range of motion with shoulder overhead ;not wearing a sling or compression and not really wearing any more wrist splint or  sleeping with her splint Goal status: INITIAL  LONG TERM GOALS: Target date: 12 wks  Patient to be independent in home program and progression as radial nerve improves to facilitate wrist  extension and thumb and digit extension. Baseline: Stiffness in wrist extension.  No wrist extension against gravity.  Partial active assisted range of motion to neutral without gravity on table no digit extension and limited or impaired thumb radial and palmar abduction. Goal status: INITIAL  2.  Left shoulder active range of motion increased to within functional limits to be able to initiate strengthening Baseline: Only passive and active assisted range of motion in supine for shoulder flexion and passive range of motion for shoulder abduction in supine to 90-surgery 05-17-2023 Goal status: INITIAL  3.  Left elbow flexion and extension improve to within functional limits for patient to reach down to feet as well as washing comb hair. Baseline: Elbow extension coming in -50 and during session after heat in supine with active assisted range of motion -38, flexion 130 degrees. Goal status: INITIAL  4.  Upgrade goals as patient is progressing with fracture healing and radial nerve Baseline: Patient only had surgery about 12 days ago. Goal status: INITIAL  5.  Patient to be independent in scar mobilization to increase motion and elbow flexion and extension. Baseline: Steri-Strips still in place on incision over midshaft of humerus Goal status: INITIAL  ASSESSMENT:  CLINICAL IMPRESSION: Patient seen for occupational therapy for left midshaft humerus fracture with ORIF by Dr. Carola Frost patient also has a radial nerve injury from fall.   Elbow extension improve greatly - Reinforce  patient again to continue with  elbow extension stretches and scar massage.  This date focused on some scar mobilization to middle scar causing elbow to pull and flexion.  And extended myofascial and  scar mobilization incorporating elbow extension with facilitation of wrist and digit extension today in gym -focus today on endrange elbow extension activating tricep against wall / or on table or chair into towel, ball or pillow  with wrist neutral. Address weight ball with hand in fist - facilitate wrist extention.  Reinforced with patient to do several times during the day while at work.  4-5 times a day with quality more than quantity and motion.  Flexor stretches in between with low reps 6-7.  Tinel of radial nerve at dorsal wrist.  Nerve pain mostly in the thumb.  Patient limited in functional use of left upper extremity because of fracture and radial nerve injury patient with decreased range of motion and strength as well as nerve injury.  Patient can benefit from skilled OT services to increase motion increase strength to return to prior level of function.  PERFORMANCE DEFICITS: in functional skills including ADLs, IADLs, coordination, sensation, edema, ROM, strength, pain, flexibility, decreased knowledge of precautions, decreased knowledge of use of DME, and UE functional use,    IMPAIRMENTS: are limiting patient from ADLs, IADLs, rest and sleep, work, play, leisure, and social participation.   COMORBIDITIES: has no other co-morbidities that affects occupational performance. Patient will benefit from skilled OT to address above impairments and improve overall function.  MODIFICATION OR ASSISTANCE TO COMPLETE EVALUATION: Min-Moderate modification of tasks or assist with assess necessary to complete an evaluation.  OT OCCUPATIONAL PROFILE AND HISTORY: Detailed assessment: Review of records and additional review of physical, cognitive, psychosocial history related to current functional performance.  CLINICAL DECISION MAKING: Moderate - several treatment options, min-mod task modification necessary  REHAB POTENTIAL: Good  EVALUATION COMPLEXITY: Moderate  PLAN: OT FREQUENCY: 1-2x/week  OT DURATION: 12 weeks  PLANNED INTERVENTIONS: 97535 self care/ADL training, 40981 therapeutic exercise, 97140 manual therapy, 97039 fluidotherapy, 97010 moist heat, 97034 contrast bath, scar mobilization, passive range of motion,  patient/family education, and DME and/or AE instructions  CONSULTED AND AGREED WITH PLAN OF CARE: Patient  Oletta Cohn, OTR/L,CLT 08/20/2023, 6:11 PM

## 2023-08-23 ENCOUNTER — Ambulatory Visit: Payer: BC Managed Care – PPO | Admitting: Occupational Therapy

## 2023-08-23 DIAGNOSIS — M6281 Muscle weakness (generalized): Secondary | ICD-10-CM

## 2023-08-23 DIAGNOSIS — L905 Scar conditions and fibrosis of skin: Secondary | ICD-10-CM

## 2023-08-23 DIAGNOSIS — G5632 Lesion of radial nerve, left upper limb: Secondary | ICD-10-CM

## 2023-08-23 DIAGNOSIS — M25622 Stiffness of left elbow, not elsewhere classified: Secondary | ICD-10-CM

## 2023-08-23 NOTE — Therapy (Signed)
OUTPATIENT OCCUPATIONAL THERAPY ORTHO TREATMENT/RECERT  Patient Name: Marissa Harrison MRN: [id number] DOB:1975-11-24, 48 y.o., female  PCP: Darrick Penna NP REFERRING PROVIDER: Dr Carola Frost  END OF SESSION:  OT End of Session - 08/23/23 1017     Visit Number 24    Number of Visits 38    Date for OT Re-Evaluation 11/15/23    Progress Note Due on Visit 901    OT Start Time 0815    OT Stop Time 0901    OT Time Calculation (min) 46 min    Activity Tolerance Patient tolerated treatment well    Behavior During Therapy [facility] for tasks assessed/performed              Past Medical History:  Diagnosis Date   HSV infection    Past Surgical History:  Procedure Laterality Date   BREAST BIOPSY Right 04/13/2017   BENIGN NODULAR ADENOSIS, wing shaped marker   BREAST BIOPSY Left 11/14/2022   [place] Core Bx Ribbon clip path pending   BREAST BIOPSY Left 11/14/2022   [place] LT BREAST BX W LOC DEV 1ST LESION IMG BX SPEC US GUIDE 11/14/2022 [facility]   BREAST CYST ASPIRATION Right 2007   neg   CESAREAN SECTION  1992   ORIF HUMERUS FRACTURE Left 05/17/2023   Procedure: OPEN REDUCTION INTERNAL FIXATION (ORIF) LEFT HUMERUS FRACTURE;  Surgeon: Myrene Galas, MD;  Location: [facility] OR;  Service: Orthopedics;  Laterality: Left;   TUBAL LIGATION  2003   There are no active problems to display for this patient.   ONSET DATE: 05/17/23  REFERRING DIAG: L humerus shaft fx with ORIF and Radial N injury  THERAPY DIAG:  Radial nerve palsy, left  Stiffness of left elbow, not elsewhere classified  Muscle weakness (generalized)  Scar condition and fibrosis of skin  Rationale for Evaluation and Treatment: Rehabilitation  SUBJECTIVE:   SUBJECTIVE STATEMENT: I can feel the nerve over the top of my hand where it used to be at the wrist.  Is just very disappointing the progress in my hand.   And in the wrist  PERTINENT HISTORY:  OP NOTE DR HANDY on 05/17/23: PRE-OPERATIVE DIAGNOSIS:   1. LEFT HUMERAL SHAFT  FRACTURE 2. RADIAL NERVE INJURY   POST-OPERATIVE DIAGNOSIS:   1. LEFT HUMERAL SHAFT FRACTURE 2. RADIAL NERVE INJURED BUT LARGELY INTACT   PROCEDURE:  Procedure(s): 1. OPEN REDUCTION INTERNAL FIXATION (ORIF) LEFT HUMERAL SHAFT FRACTURE  2. EXPLORATION AND NEUROPLASTY OF RADIAL NERVE      BRIEF SUMMARY AND INDICATIONS FOR PROCEDURE:  Marissa Harrison is a 48 y.o. who sustained fall resulting in displaced transverse humeral shaft fracture and complete radial nerve deficit.   PRECAUTIONS: No AROM shoulder ABD - not even gravity; AAROM and PROM for wrist , elbow and shoulder flexion; NWB ; Radial N injury  WEIGHT BEARING RESTRICTIONS: No  PAIN:  Are you having pain?  Wrist and thumb pain 2-4/10 nerve pain - colder worse FALLS: Has patient fallen in last 6 months? Yes. 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  And alsothis nerve injury.  NEXT MD VISIT: 15th Jan 25 OBJECTIVE:  Note: Objective measures were completed at Evaluation unless otherwise noted.  HAND DOMINANCE: Right  UPPER EXTREMITY ROM:       Passive ROM  Right eval Left eval L 05/31/23 L 06/11/23 L 06/14/23 L 06/18/23 L 06/21/23 L 06/26/23 L 07/10/23 L 07/24/23 L  08/07/23  Shoulder flexion  140 supine 120 PROM supine PROM supine 140 AROM 160 160       Shoulder abduction  PROM 90 supine PROM 90 supine PROM supine 90 95 AROM  155       Shoulder adduction             Shoulder extension     55 55       Shoulder internal rotation             Shoulder external rotation             Elbow flexion  130 AAROM 140 AAROM 150 150 150   150    Elbow extension  -50 coming in - in session -38 AAROM supine after heat -38 coming in - in session -25  -25 coming in - session -15 to -20 -20 to -25 -20 to -23 -15 to -18  -5 to -15 -8  -8 but after  Exs -6   Wrist flexion  90  80         Wrist extension  On table- without gravity - to neutral with AAROM   AROM without gravity to neutral      Over edge of table AROM -55 and place and hold -45  Over edge of table AROM -55 and place and hold -45 Over edge AROM -42 and place and hold -35 - after flexor stretch -35 and place and hold -25   Wrist ulnar deviation  On table - 30  3- sliding on paper on table          Wrist radial deviation  On table - 20  20- slding on table on paper         Wrist pronation  90  90         Wrist supination  90  90         (Blank rows = not tested)  Active ROM Right eval Left eval L 06/11/23 L 06/21/23 L 07/19/23 L 07/26/23 L 08/07/23  Thumb MCP (0-60)         Thumb IP (0-80)         Thumb Radial abd/add (0-55)  Decrease at [facility]   Decrease [facility]    48 Mostly out of [facility]   Thumb Palmar abd/add (0-45)  Unable to maintain - drop into flexion   Able to keep in PA 6 reps place and hold- but drop in flexion with ADD or after 6 reps  Able to maintain palmar abduction position for 10-12 reps.  Able to initiate rubber band for resistance.  30 Able to do PA but drop in flexion with ADD   Thumb Opposition to Small Finger  Unable         Index MCP (0-90)      70 70 -60  Index PIP (0-100)      80 75   Index DIP (0-70)           Long MCP (0-90)       65 65 -60  Long PIP (0-100)        80   Long DIP (0-70)           Ring MCP (0-90)       70 70 -55  Ring PIP (0-100)       90 80  Ring DIP (0-70)           Little MCP (0-90)         -40  Little PIP (0-100)           Little DIP (0-70)           Pt can make fist - unable to extend digits- if [facility] support at [facility] - can extend PIP's  at eval    UPPER EXTREMITY MMT:     MMT Right eval Left eval  Shoulder flexion    Shoulder abduction    Shoulder adduction    Shoulder extension    Shoulder internal rotation    Shoulder external rotation    Middle trapezius    Lower trapezius    Elbow flexion    Elbow  extension    Wrist flexion    Wrist extension    Wrist ulnar deviation    Wrist radial deviation    Wrist pronation    Wrist supination    (Blank rows = not tested)  HAND FUNCTION: 07/19/23:   Grip strength R 65#, left 25# Lateral pinch R 15#, L5# 3 point pinch Right 15#, L3# 2 point pinch R 10#, L1#  08/16/23:   Grip strength R 65#, left 32# resting on thigh and support wrist,Lateral pinch R 15#, L7#; wrist support; 3 point pinch Right 15#, L8# wrist support  COORDINATION: Radial N injury - impaired   SENSATION: Tinel at dorsal wrist -and nerve pain mostly in thumb now   EDEMA: improved greatly  COGNITION: Overall cognitive status: Within functional limits for tasks assessed  TODAY'S TREATMENT:                                                                                                                              DATE: 08/23/23 Tinel of radial nerve improving.  This date over dorsal hand.  Used to be over dorsal wrist. Patient continues to struggle and show improvement for wrist extension and digit extension.  Paraffin done this date to elbow and upper arm scar Time: 8 Location: elbow   Elbow extention stretch with 4 lbs weight with hand in pronation prior to ROM  and Manual therapy:  Scar massage done over upper arm scar especially with forearm in pronation.  Traction together with cross friction together with extractor. Scar tight pulling elbow in slight flexion  Therexercises:  Facilitate this date wrist extension in neutral and elbow - stabilization fist on towel 2 sets of 10 hold 5 seconds each-  Roll weighted 4 lbs ball with close fist into wrist and elbow exention 1 minute up onto wall    4 kg ball hands either side - RD, UD with elbow to side  -focus on radial deviation 2 sets of 12 reps Roll for kilogram ball with elbow to side into supination pronation.  Able to hold 4  kg ball on palm and supination 2 sets of 10 Patient to focus  on pushing into towel,  pillow or ball  with fist for end range elbow extention and wrist neutral - stabilization  At home  Rolling ball with hand in fist - facilitate wrist extention - can do at home on table in sitting   This date with wrist extension splint on the scapular squeezes 4 pounds 12 reps Shoulder extension with elbow extension 12 reps 4 pounds With posterior upper arm against doorframe bicep curl 10 reps.  4 pounds-patient needed verbal cueing to block upper arm against doorframe as well as focusing on bicep curl Patient can also do neutral position thumb opposition Attempted and done 2 pound weight horizontal adduction flies with elbow at 90 degrees against the wall 12 reps no issues But overhead 2 pounds instability or incoordination low but with weakness. Patient done better bilaterally holding 8 pounds either side and facilitate with right upper extremity shoulder flexion 2 sets of 12 reps Patient can do at home using 4 pounds.  Attempted and facilitated wrist extension on table without gravity blocking forearm sliding on tissue paper-able to get to neutral.  Has a hard time facilitating any wrist extension past neutral.    Pt to continue with wearing wrist brace to avoid over stretching of extensors.  But can alternate into [facility] neoprene hour at time and wrist splint 2 hrs - alternate   PATIENT EDUCATION: Education details: Findings of evaluation and home program as well as splint wearing Person educated: Patient Education method: Explanation, Demonstration, Tactile cues, Verbal cues, and Handouts Education comprehension: verbalized understanding, returned demonstration, verbal cues required, tactile cues required, and needs further education  GOALS: Goals reviewed with patient? Yes  SHORT TERM GOALS: Target date: 4 wks   Patient to be independent and wearing splints correctly to prevent flexion contractures as well as facilitating functional use of left hand Baseline: Patient arrived with  wrist splint on but report not really wearing it.  Patient with some discomfort at wrist as well as tightness with wrist extension.  Patient to wear wrist splint when doing elbow and shoulder exercises.  Info provided to order prefab radial nerve splint. NOW patient wore prefab wrist splints.  Patient hard time with facilitating wrist extension past neutral.  Will assess next session for custom static or dynamic wrist extension splint to rest wrist in slight extension/functional extension Goal status:ONGOING  2.  Patient to be independent in home program to initiate passive range of motion and active assisted range of motion to left shoulder to prevent stiffness until next appointment with surgeon Baseline: Patient reports she has been doing active range of motion with shoulder overhead ;not wearing a sling or compression and not really wearing any more wrist splint or  sleeping with her splint Goal status: MET  LONG TERM GOALS: Target date: 12 wks  Patient to be independent in home program and progression as radial nerve improves to facilitate wrist extension and thumb and digit extension. Baseline: Stiffness in wrist extension.  No wrist extension against gravity.  Partial active assisted range of motion to neutral without gravity on table no digit extension and limited or impaired thumb radial and palmar abduction. NOW can facilitate wrist extension to neutral without gravity on table.  Against gravity place and hold -25.  Tinel improving this week to dorsal hand Goal status ONGOING  2.  Left shoulder active range of motion increased to within functional limits to be able to initiate strengthening Baseline: Only passive and active assisted range of motion in supine  for shoulder flexion and passive range of motion for shoulder abduction in supine to 90-surgery 05-17-2023 Goal status: MET  3.  Left elbow flexion and extension improve to within functional limits for patient to reach down to feet as  well as washing comb hair. Baseline: Elbow extension coming in -50 and during session after heat in supine with active assisted range of motion -38, flexion 130 degrees. Goal status: MET  4.  Strength in left shoulder and elbow improved for patient to participate in more than 8 pounds of weight to be able to carry a gallon of milk and can transition to community gym for workouts Baseline: Patient at 4 pound weight for workouts elbow and shoulder.  Overhead still increased weakness with incoordination with a 2 pound weight, radial nerve still unable to stabilize wrist extension and digit extension Goal status: ONGOING  5.  Patient to be independent in scar mobilization to increase motion and elbow flexion and extension. Baseline: Steri-Strips still in place on incision over midshaft of humerus Goal status: IMET  ASSESSMENT:  CLINICAL IMPRESSION: Patient seen for occupational therapy for left midshaft humerus fracture with ORIF by Dr. Carola Frost patient also has a radial nerve injury from fall.   Patient had made great progress in shoulder and elbow range of motion able to initiate more strengthening.  Participate in 4 pound weights for elbow and shoulder as well as starting on [employer] machine.  Overhead still in coordination and instability using 2 pound weight.  Progress limited by radial nerve.  Radial nerve positive Tinel is over dorsal hand this week.  Patient mostly limited by radial nerve causing decrease wrist and digit extension as well as thumb.  Focus extended myofascial and scar mobilization focus on endrange elbow extension activating tricep against wall / or on table or chair into towel, ball or pillow with wrist neutral.   Reinforced with patient to do  HEP several times during the day while at work.  4-5 times a day with quality more than quantity and motion.  Flexor stretches in between with low reps 6-7.   Pt  with decreased  strength as well as  radial nerve injury.  Patient can benefit  from skilled OT services to increase motion increase strength to return to prior level of function.  PERFORMANCE DEFICITS: in functional skills including ADLs, IADLs, coordination, sensation, edema, ROM, strength, pain, flexibility, decreased knowledge of precautions, decreased knowledge of use of DME, and UE functional use,    IMPAIRMENTS: are limiting patient from ADLs, IADLs, rest and sleep, work, play, leisure, and social participation.   COMORBIDITIES: has no other co-morbidities that affects occupational performance. Patient will benefit from skilled OT to address above impairments and improve overall function.  MODIFICATION OR ASSISTANCE TO COMPLETE EVALUATION: Min-Moderate modification of tasks or assist with assess necessary to complete an evaluation.  OT OCCUPATIONAL PROFILE AND HISTORY: Detailed assessment: Review of records and additional review of physical, cognitive, psychosocial history related to current functional performance.  CLINICAL DECISION MAKING: Moderate - several treatment options, min-mod task modification necessary  REHAB POTENTIAL: Good  EVALUATION COMPLEXITY: Moderate  PLAN: OT FREQUENCY: 1-2x/week  OT DURATION: 12 weeks  PLANNED INTERVENTIONS: 97535 self care/ADL training, 84132 therapeutic exercise, 97140 manual therapy, 97039 fluidotherapy, 97010 moist heat, 97034 contrast bath, scar mobilization, passive range of motion, patient/family education, and DME and/or AE instructions  CONSULTED AND AGREED WITH PLAN OF CARE: Patient  Oletta Cohn, OTR/L,CLT 08/23/2023, 10:19 AM

## 2023-08-28 ENCOUNTER — Ambulatory Visit: Payer: BC Managed Care – PPO | Admitting: Occupational Therapy

## 2023-08-28 DIAGNOSIS — G5632 Lesion of radial nerve, left upper limb: Secondary | ICD-10-CM

## 2023-08-28 DIAGNOSIS — M6281 Muscle weakness (generalized): Secondary | ICD-10-CM

## 2023-08-28 NOTE — Therapy (Signed)
OUTPATIENT OCCUPATIONAL THERAPY ORTHO TREATMENT/  Patient Name: Marissa Harrison MRN: [id number] DOB:1975-08-24, 48 y.o., female  PCP: Marissa Penna NP REFERRING PROVIDER: Dr Marissa Harrison  END OF SESSION:  OT End of Session - 08/28/23 1711     Visit Number 25    Number of Visits 38    Date for OT Re-Evaluation 11/15/23    OT Start Time 1532    OT Stop Time 1619    OT Time Calculation (min) 47 min    Activity Tolerance Patient tolerated treatment well    Behavior During Therapy Marissa Harrison for tasks assessed/performed              Past Medical History:  Diagnosis Date   HSV infection    Past Surgical History:  Procedure Laterality Date   BREAST BIOPSY Right 04/13/2017   BENIGN NODULAR ADENOSIS, wing shaped marker   BREAST BIOPSY Left 11/14/2022   [place] Core Bx Ribbon clip path pending   BREAST BIOPSY Left 11/14/2022   [place] LT BREAST BX W LOC DEV 1ST LESION IMG BX SPEC US GUIDE 11/14/2022 [facility]   BREAST CYST ASPIRATION Right 2007   neg   CESAREAN SECTION  1992   ORIF HUMERUS FRACTURE Left 05/17/2023   Procedure: OPEN REDUCTION INTERNAL FIXATION (ORIF) LEFT HUMERUS FRACTURE;  Surgeon: Marissa Galas, MD;  Location: [facility] OR;  Service: Orthopedics;  Laterality: Left;   TUBAL LIGATION  2003   There are no active problems to display for this patient.   ONSET DATE: 05/17/23  REFERRING DIAG: L humerus shaft fx with ORIF and Radial N injury  THERAPY DIAG:  Radial nerve palsy, left  Muscle weakness (generalized)  Rationale for Evaluation and Treatment: Rehabilitation  SUBJECTIVE:   SUBJECTIVE STATEMENT: I have this not on the top of my wrist that has been bothering me probably for the last week.  A little tender.  But the nerve pain has been better in my hand.  PERTINENT HISTORY:  OP NOTE DR HANDY on 05/17/23: PRE-OPERATIVE DIAGNOSIS:   1. LEFT HUMERAL SHAFT FRACTURE 2. RADIAL NERVE INJURY   POST-OPERATIVE DIAGNOSIS:   1. LEFT HUMERAL SHAFT FRACTURE 2. RADIAL NERVE INJURED BUT  LARGELY INTACT   PROCEDURE:  Procedure(s): 1. OPEN REDUCTION INTERNAL FIXATION (ORIF) LEFT HUMERAL SHAFT FRACTURE  2. EXPLORATION AND NEUROPLASTY OF RADIAL NERVE      BRIEF SUMMARY AND INDICATIONS FOR PROCEDURE:  [name] is a 48 y.o. who sustained fall resulting in displaced transverse humeral shaft fracture and complete radial nerve deficit.   PRECAUTIONS: No AROM shoulder ABD - not even gravity; AAROM and PROM for wrist , elbow and shoulder flexion; NWB ; Radial N injury  WEIGHT BEARING RESTRICTIONS: No  PAIN:  Are you having pain?  Wrist and thumb pain 2-4/10 nerve pain - colder worse FALLS: Has patient fallen in last 6 months? Yes. 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  And alsothis nerve injury.  NEXT MD VISIT: 15th Jan 25 OBJECTIVE:  Note: Objective measures were completed at Evaluation unless otherwise noted.  HAND DOMINANCE: Right  UPPER EXTREMITY ROM:       Passive ROM Right eval Left eval L 05/31/23 L 06/11/23 L 06/14/23 L 06/18/23 L 06/21/23 L 06/26/23 L 07/10/23 L 07/24/23 L  08/07/23  Shoulder flexion  140 supine 120 PROM supine PROM supine 140 AROM 160 160       Shoulder abduction  PROM 90 supine PROM 90 supine PROM supine 90 95 AROM  155       Shoulder adduction             Shoulder extension     55 55       Shoulder internal rotation             Shoulder external rotation             Elbow flexion  130 AAROM 140 AAROM 150 150 150   150    Elbow extension  -50 coming in - in session -38 AAROM supine after heat -38 coming in - in session -25  -25 coming in - session -15 to -20 -20 to -25 -20 to -23 -15 to -18  -5 to -15 -8 -8 but after  Exs -6   Wrist flexion  90  80         Wrist extension  On table- without gravity - to neutral with AAROM    AROM without gravity to neutral      Over edge of table AROM -55 and place and hold -45  Over edge of table AROM -55 and place and hold -45 Over edge AROM -42 and place and hold -35 - after flexor stretch -35 and place and hold -25   Wrist ulnar deviation  On table - 30  3- sliding on paper on table          Wrist radial deviation  On table - 20  20- slding on table on paper         Wrist pronation  90  90         Wrist supination  90  90         (Blank rows = not tested)  Active ROM Right eval Left eval L 06/11/23 L 06/21/23 L 07/19/23 L 07/26/23 L 08/07/23  Thumb MCP (0-60)         Thumb IP (0-80)         Thumb Radial abd/add (0-55)  Decrease at [facility]   Decrease [facility]    48 Mostly out of [facility]   Thumb Palmar abd/add (0-45)  Unable to maintain - drop into flexion   Able to keep in PA 6 reps place and hold- but drop in flexion with ADD or after 6 reps  Able to maintain palmar abduction position for 10-12 reps.  Able to initiate rubber band for resistance.  30 Able to do PA but drop in flexion with ADD   Thumb Opposition to Small Finger  Unable         Index MCP (0-90)      70 70 -60  Index PIP (0-100)      80 75   Index DIP (0-70)           Long MCP (0-90)       65 65 -60  Long PIP (0-100)        80   Long DIP (0-70)           Ring MCP (0-90)       70 70 -55  Ring PIP (0-100)       90 80   Ring DIP (0-70)           Little MCP (0-90)         -  40  Little PIP (0-100)           Little DIP (0-70)           Pt can make fist - unable to extend digits- if [facility] support at [facility] - can extend PIP's  at eval    UPPER EXTREMITY MMT:     MMT Right eval Left eval  Shoulder flexion    Shoulder abduction    Shoulder adduction    Shoulder extension    Shoulder internal rotation    Shoulder external rotation    Middle trapezius    Lower trapezius    Elbow flexion    Elbow extension    Wrist flexion    Wrist extension    Wrist ulnar deviation    Wrist radial deviation    Wrist pronation     Wrist supination    (Blank rows = not tested)  HAND FUNCTION: 07/19/23:   Grip strength R 65#, left 25# Lateral pinch R 15#, L5# 3 point pinch Right 15#, L3# 2 point pinch R 10#, L1#  08/16/23:   Grip strength R 65#, left 32# resting on thigh and support wrist,Lateral pinch R 15#, L7#; wrist support; 3 point pinch Right 15#, L8# wrist support  COORDINATION: Radial N injury - impaired   SENSATION: Tinel at dorsal wrist -and nerve pain mostly in thumb now   EDEMA: improved greatly  COGNITION: Overall cognitive status: Within functional limits for tasks assessed  TODAY'S TREATMENT:                                                                                                                              DATE: 08/28/23 Tinel of radial nerve improving.  This date over dorsal hand to [facility].  Used to be over dorsal wrist. Patient continues to have difficulty with wrist extension past neutral as well as digit extension This date patient arrive with what appear knot over the dorsal carpal bones. Patient carries hand a lot of times and wrist flexion as well as hand cupped thumb into palm. Focus this date on opening the carpal tunnel as well as the metacarpal spreads into the webspace spreading the thumb and the radial abduction Reviewed with patient for husband to assist with that. Facilitating with that wrist extension. Done some weightbearing on the chair with carpal rolls   Therexercises:  Facilitate this date wrist extension over the table with a placing hold after passive range of motion Able to get to -35   Fabricated patient a custom wrist support with the wrist in 20-30 extension. Encouraged patient to use that for picking up objects but not with palm down but neutral to open thumb and digits encouraging Not using tenolysis to open and pick up objects  PATIENT EDUCATION: Education details: Findings of evaluation and home program as well as splint wearing Person educated:  Patient Education method: Explanation, Demonstration, Tactile cues, Verbal cues, and Handouts Education comprehension: verbalized understanding, returned  demonstration, verbal cues required, tactile cues required, and needs further education  GOALS: Goals reviewed with patient? Yes  SHORT TERM GOALS: Target date: 4 wks   Patient to be independent and wearing splints correctly to prevent flexion contractures as well as facilitating functional use of left hand Baseline: Patient arrived with wrist splint on but report not really wearing it.  Patient with some discomfort at wrist as well as tightness with wrist extension.  Patient to wear wrist splint when doing elbow and shoulder exercises.  Info provided to order prefab radial nerve splint. NOW patient wore prefab wrist splints.  Patient hard time with facilitating wrist extension past neutral.  Will assess next session for custom static or dynamic wrist extension splint to rest wrist in slight extension/functional extension Goal status:ONGOING  2.  Patient to be independent in home program to initiate passive range of motion and active assisted range of motion to left shoulder to prevent stiffness until next appointment with surgeon Baseline: Patient reports she has been doing active range of motion with shoulder overhead ;not wearing a sling or compression and not really wearing any more wrist splint or  sleeping with her splint Goal status: MET  LONG TERM GOALS: Target date: 12 wks  Patient to be independent in home program and progression as radial nerve improves to facilitate wrist extension and thumb and digit extension. Baseline: Stiffness in wrist extension.  No wrist extension against gravity.  Partial active assisted range of motion to neutral without gravity on table no digit extension and limited or impaired thumb radial and palmar abduction. NOW can facilitate wrist extension to neutral without gravity on table.  Against gravity place  and hold -25.  Tinel improving this week to dorsal hand Goal status ONGOING  2.  Left shoulder active range of motion increased to within functional limits to be able to initiate strengthening Baseline: Only passive and active assisted range of motion in supine for shoulder flexion and passive range of motion for shoulder abduction in supine to 90-surgery 05-17-2023 Goal status: MET  3.  Left elbow flexion and extension improve to within functional limits for patient to reach down to feet as well as washing comb hair. Baseline: Elbow extension coming in -50 and during session after heat in supine with active assisted range of motion -38, flexion 130 degrees. Goal status: MET  4.  Strength in left shoulder and elbow improved for patient to participate in more than 8 pounds of weight to be able to carry a gallon of milk and can transition to community gym for workouts Baseline: Patient at 4 pound weight for workouts elbow and shoulder.  Overhead still increased weakness with incoordination with a 2 pound weight, radial nerve still unable to stabilize wrist extension and digit extension Goal status: ONGOING  5.  Patient to be independent in scar mobilization to increase motion and elbow flexion and extension. Baseline: Steri-Strips still in place on incision over midshaft of humerus Goal status: IMET  ASSESSMENT:  CLINICAL IMPRESSION: Patient seen for occupational therapy for left midshaft humerus fracture with ORIF by Dr. Carola Harrison patient also has a radial nerve injury from fall.   Patient had made great progress in shoulder and elbow range of motion able to initiate more strengthening.  Participate in 4 pound weights for elbow and shoulder as well as starting on [employer] machine.  Overhead still in coordination and instability using 2 pound weight.  Progress limited by radial nerve.  Radial nerve positive Tinel is over  dorsal hand this week.  Patient mostly limited by radial nerve causing decrease  wrist and digit extension as well as thumb.  Reinforced with patient to do  HEP several times during the day while at work.  4-5 times a day with quality more than quantity and motion.  Focus this date on a lot of soft tissue for opening that palm, carpal tunnel metacarpals and webspace.  As well as in combination with wrist extension.  Patient mostly impaired in progress of her wrist extension and digit extension.  Fabricated patient a custom wrist brace for wrist in 20-30 extension and patient encourage to grasp and release objects in neutral and not using a tenolysis grip -  pt  with decreased  strength as well as  radial nerve injury.  Patient can benefit from skilled OT services to increase motion increase strength to return to prior level of function.  PERFORMANCE DEFICITS: in functional skills including ADLs, IADLs, coordination, sensation, edema, ROM, strength, pain, flexibility, decreased knowledge of precautions, decreased knowledge of use of DME, and UE functional use,    IMPAIRMENTS: are limiting patient from ADLs, IADLs, rest and sleep, work, play, leisure, and social participation.   COMORBIDITIES: has no other co-morbidities that affects occupational performance. Patient will benefit from skilled OT to address above impairments and improve overall function.  MODIFICATION OR ASSISTANCE TO COMPLETE EVALUATION: Min-Moderate modification of tasks or assist with assess necessary to complete an evaluation.  OT OCCUPATIONAL PROFILE AND HISTORY: Detailed assessment: Review of records and additional review of physical, cognitive, psychosocial history related to current functional performance.  CLINICAL DECISION MAKING: Moderate - several treatment options, min-mod task modification necessary  REHAB POTENTIAL: Good  EVALUATION COMPLEXITY: Moderate  PLAN: OT FREQUENCY: 1-2x/week  OT DURATION: 12 weeks  PLANNED INTERVENTIONS: 97535 self care/ADL training, 16109 therapeutic exercise, 97140  manual therapy, 97039 fluidotherapy, 97010 moist heat, 97034 contrast bath, scar mobilization, passive range of motion, patient/family education, and DME and/or AE instructions  CONSULTED AND AGREED WITH PLAN OF CARE: Patient  Oletta Cohn, OTR/L,CLT 08/28/2023, 5:13 PM

## 2023-08-30 ENCOUNTER — Ambulatory Visit: Payer: BC Managed Care – PPO | Admitting: Occupational Therapy

## 2023-08-30 DIAGNOSIS — G5632 Lesion of radial nerve, left upper limb: Secondary | ICD-10-CM

## 2023-08-30 DIAGNOSIS — M6281 Muscle weakness (generalized): Secondary | ICD-10-CM

## 2023-08-30 NOTE — Therapy (Signed)
OUTPATIENT OCCUPATIONAL THERAPY ORTHO TREATMENT  Patient Name: Marissa Harrison MRN: [id number] DOB:10-Jul-1976, 48 y.o., female  PCP: Darrick Penna NP REFERRING PROVIDER: Dr Carola Frost  END OF SESSION:  OT End of Session - 08/30/23 1925     Visit Number 26    Number of Visits 38    Date for OT Re-Evaluation 11/15/23    OT Start Time 1531    OT Stop Time 1612    OT Time Calculation (min) 41 min    Activity Tolerance Patient tolerated treatment well    Behavior During Therapy [facility] for tasks assessed/performed              Past Medical History:  Diagnosis Date   HSV infection    Past Surgical History:  Procedure Laterality Date   BREAST BIOPSY Right 04/13/2017   BENIGN NODULAR ADENOSIS, wing shaped marker   BREAST BIOPSY Left 11/14/2022   [place] Core Bx Ribbon clip path pending   BREAST BIOPSY Left 11/14/2022   [place] LT BREAST BX W LOC DEV 1ST LESION IMG BX SPEC US GUIDE 11/14/2022 [facility]   BREAST CYST ASPIRATION Right 2007   neg   CESAREAN SECTION  1992   ORIF HUMERUS FRACTURE Left 05/17/2023   Procedure: OPEN REDUCTION INTERNAL FIXATION (ORIF) LEFT HUMERUS FRACTURE;  Surgeon: Myrene Galas, MD;  Location: [facility] OR;  Service: Orthopedics;  Laterality: Left;   TUBAL LIGATION  2003   There are no active problems to display for this patient.   ONSET DATE: 05/17/23  REFERRING DIAG: L humerus shaft fx with ORIF and Radial N injury  THERAPY DIAG:  Radial nerve palsy, left  Muscle weakness (generalized)  Rationale for Evaluation and Treatment: Rehabilitation  SUBJECTIVE:   SUBJECTIVE STATEMENT: I did see Dr. Carola Frost yesterday.  Was very happy with my elbow range of motion strength and the fracture is healed.  I started home some strengthening.  And he sent me for a nerve conduction test.  I want you to work on my wrist.  PERTINENT HISTORY:  OP NOTE DR HANDY on 05/17/23: PRE-OPERATIVE DIAGNOSIS:   1. LEFT HUMERAL SHAFT FRACTURE 2. RADIAL NERVE INJURY   POST-OPERATIVE  DIAGNOSIS:   1. LEFT HUMERAL SHAFT FRACTURE 2. RADIAL NERVE INJURED BUT LARGELY INTACT   PROCEDURE:  Procedure(s): 1. OPEN REDUCTION INTERNAL FIXATION (ORIF) LEFT HUMERAL SHAFT FRACTURE  2. EXPLORATION AND NEUROPLASTY OF RADIAL NERVE      BRIEF SUMMARY AND INDICATIONS FOR PROCEDURE:  Marissa Harrison is a 48 y.o. who sustained fall resulting in displaced transverse humeral shaft fracture and complete radial nerve deficit.   PRECAUTIONS: No AROM shoulder ABD - not even gravity; AAROM and PROM for wrist , elbow and shoulder flexion; NWB ; Radial N injury  WEIGHT BEARING RESTRICTIONS: No  PAIN:  Are you having pain?  Dorsal Wrist tender- 7/10  FALLS: Has patient fallen in last 6 months? Yes. 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  And alsothis nerve injury.  NEXT MD VISIT: after nerve conduction test OBJECTIVE:  Note: Objective measures were completed at Evaluation unless otherwise noted.  HAND DOMINANCE: Right  UPPER EXTREMITY ROM:       Passive ROM Right eval Left eval L 05/31/23 L 06/11/23 L 06/14/23 L 06/18/23 L 06/21/23 L 06/26/23  L 07/10/23 L 07/24/23 L  08/07/23  Shoulder flexion  140 supine 120 PROM supine PROM supine 140 AROM 160 160       Shoulder abduction  PROM 90 supine PROM 90 supine PROM supine 90 95 AROM  155       Shoulder adduction             Shoulder extension     55 55       Shoulder internal rotation             Shoulder external rotation             Elbow flexion  130 AAROM 140 AAROM 150 150 150   150    Elbow extension  -50 coming in - in session -38 AAROM supine after heat -38 coming in - in session -25  -25 coming in - session -15 to -20 -20 to -25 -20 to -23 -15 to -18  -5 to -15 -8 -8 but after  Exs -6   Wrist flexion  90  80         Wrist  extension  On table- without gravity - to neutral with AAROM   AROM without gravity to neutral      Over edge of table AROM -55 and place and hold -45  Over edge of table AROM -55 and place and hold -45 Over edge AROM -42 and place and hold -35 - after flexor stretch -35 and place and hold -25   Wrist ulnar deviation  On table - 30  3- sliding on paper on table          Wrist radial deviation  On table - 20  20- slding on table on paper         Wrist pronation  90  90         Wrist supination  90  90         (Blank rows = not tested)  Active ROM Right eval Left eval L 06/11/23 L 06/21/23 L 07/19/23 L 07/26/23 L 08/07/23  Thumb MCP (0-60)         Thumb IP (0-80)         Thumb Radial abd/add (0-55)  Decrease at [facility]   Decrease [facility]    48 Mostly out of [facility]   Thumb Palmar abd/add (0-45)  Unable to maintain - drop into flexion   Able to keep in PA 6 reps place and hold- but drop in flexion with ADD or after 6 reps  Able to maintain palmar abduction position for 10-12 reps.  Able to initiate rubber band for resistance.  30 Able to do PA but drop in flexion with ADD   Thumb Opposition to Small Finger  Unable         Index MCP (0-90)      70 70 -60  Index PIP (0-100)      80 75   Index DIP (0-70)           Long MCP (0-90)       65 65 -60  Long PIP (0-100)        80   Long DIP (0-70)           Ring MCP (0-90)       70 70 -55  Ring PIP (0-100)       90 80   Ring DIP (0-70)           Little MCP (0-90)         -  40  Little PIP (0-100)           Little DIP (0-70)           Pt can make fist - unable to extend digits- if [facility] support at [facility] - can extend PIP's  at eval    UPPER EXTREMITY MMT:     MMT Right eval Left eval  Shoulder flexion    Shoulder abduction    Shoulder adduction    Shoulder extension    Shoulder internal rotation    Shoulder external rotation    Middle trapezius    Lower trapezius    Elbow flexion    Elbow extension    Wrist flexion    Wrist extension    Wrist ulnar  deviation    Wrist radial deviation    Wrist pronation    Wrist supination    (Blank rows = not tested)  HAND FUNCTION: 07/19/23:   Grip strength R 65#, left 25# Lateral pinch R 15#, L5# 3 point pinch Right 15#, L3# 2 point pinch R 10#, L1#  08/16/23:   Grip strength R 65#, left 32# resting on thigh and support wrist,Lateral pinch R 15#, L7#; wrist support; 3 point pinch Right 15#, L8# wrist support  COORDINATION: Radial N injury - impaired   SENSATION: Tinel at dorsal wrist -and nerve pain mostly in thumb now   EDEMA: improved greatly  COGNITION: Overall cognitive status: Within functional limits for tasks assessed  TODAY'S TREATMENT:                                                                                                                              DATE: 08/30/23  Tinel of radial nerve improving.  Over dorsal hand to [facility].  Used to be over dorsal wrist. Patient continues to have difficulty with wrist extension past neutral as well as digit extension This week patient arrive with what appear knot over the dorsal carpal bones. ? Capitate carpal bone migrating more dorsal - crowed by proximal row No pain with wrist extention  Assess traction, compression and dorsal and volar joint mobs to wrist - no pain But tender 7/10 Patient  use to carry hand a lot of times and wrist  in flexion as well as hand cupped thumb into palm.  And no splint on Using tenolysis to pick up objects Focus this date on opening the carpal tunnel as well as the metacarpal spreads  webspace spreading the thumb and the radial abduction But this date done mobilization to proximal row and facilitate Capitate in place   Less pain and less prominent afterwards  No tenderness PROM to wrist extention  Followed by place and hold over table - with hand in fist -15 degrees Review with pt splint wearing this weekend most all the time Facilitate wrist ext with gravity over armrest - with contract relax into  OT's finger  And pushing down NOT into digits but volar wrist down  Cont when sitting to wear custom wrist support with the wrist in 20-30 extension. Encouraged patient to  sue this or prefab wrist splint when using hand at all -and reach to pick up NOT palm down but neutral - using thumb PA and digits extention  Not using tenolysis to open and pick up objects  PATIENT EDUCATION: Education details: home program as well as splint wearing Person educated: Patient Education method: Explanation, Demonstration, Tactile cues, Verbal cues, and Handouts Education comprehension: verbalized understanding, returned demonstration, verbal cues required, tactile cues required, and needs further education  GOALS: Goals reviewed with patient? Yes  SHORT TERM GOALS: Target date: 4 wks   Patient to be independent and wearing splints correctly to prevent flexion contractures as well as facilitating functional use of left hand Baseline: Patient arrived with wrist splint on but report not really wearing it.  Patient with some discomfort at wrist as well as tightness with wrist extension.  Patient to wear wrist splint when doing elbow and shoulder exercises.  Info provided to order prefab radial nerve splint. NOW patient wore prefab wrist splints.  Patient hard time with facilitating wrist extension past neutral.  Will assess next session for custom static or dynamic wrist extension splint to rest wrist in slight extension/functional extension Goal status:ONGOING  2.  Patient to be independent in home program to initiate passive range of motion and active assisted range of motion to left shoulder to prevent stiffness until next appointment with surgeon Baseline: Patient reports she has been doing active range of motion with shoulder overhead ;not wearing a sling or compression and not really wearing any more wrist splint or  sleeping with her splint Goal status: MET  LONG TERM GOALS: Target date: 12  wks  Patient to be independent in home program and progression as radial nerve improves to facilitate wrist extension and thumb and digit extension. Baseline: Stiffness in wrist extension.  No wrist extension against gravity.  Partial active assisted range of motion to neutral without gravity on table no digit extension and limited or impaired thumb radial and palmar abduction. NOW can facilitate wrist extension to neutral without gravity on table.  Against gravity place and hold -25.  Tinel improving this week to dorsal hand Goal status ONGOING  2.  Left shoulder active range of motion increased to within functional limits to be able to initiate strengthening Baseline: Only passive and active assisted range of motion in supine for shoulder flexion and passive range of motion for shoulder abduction in supine to 90-surgery 05-17-2023 Goal status: MET  3.  Left elbow flexion and extension improve to within functional limits for patient to reach down to feet as well as washing comb hair. Baseline: Elbow extension coming in -50 and during session after heat in supine with active assisted range of motion -38, flexion 130 degrees. Goal status: MET  4.  Strength in left shoulder and elbow improved for patient to participate in more than 8 pounds of weight to be able to carry a gallon of milk and can transition to community gym for workouts Baseline: Patient at 4 pound weight for workouts elbow and shoulder.  Overhead still increased weakness with incoordination with a 2 pound weight, radial nerve still unable to stabilize wrist extension and digit extension Goal status: ONGOING  5.  Patient to be independent in scar mobilization to increase motion and elbow flexion and extension. Baseline: Steri-Strips still in place on incision over midshaft of humerus Goal status: IMET  ASSESSMENT:  CLINICAL IMPRESSION: Patient seen for occupational therapy for left midshaft humerus fracture with ORIF by Dr.  Carola Frost patient also has a radial nerve injury from fall.   Patient had made great progress in shoulder and elbow range of motion able to initiate more strengthening.  Participate in 4 pound weights for elbow and shoulder as well as starting on [employer] machine.  Overhead still in coordination and instability using 2 pound weight.  Progress limited by radial nerve.  Radial nerve positive Tinel is over dorsal hand .Marland Kitchen  Patient mostly limited by radial nerve causing decrease wrist and digit extension as well as thumb.  Reinforced with patient to do  HEP several times during the day while at work.  But volar wrist down and wrist with loose fist over table or armrest - with gravity - Done this date mobilization of carpals to facilitate space at proximal row  to allow Capitate to set more volar - pain decrease from tenderness 7/10 to no pain - pt to wear wrist splint - and reach with splint on neutral to pick up objects - using thumb PA   Patient mostly impaired in progress of her wrist extension and digit extension.    Patient can benefit from skilled OT services to increase motion increase strength to return to prior level of function.  PERFORMANCE DEFICITS: in functional skills including ADLs, IADLs, coordination, sensation, edema, ROM, strength, pain, flexibility, decreased knowledge of precautions, decreased knowledge of use of DME, and UE functional use,    IMPAIRMENTS: are limiting patient from ADLs, IADLs, rest and sleep, work, play, leisure, and social participation.   COMORBIDITIES: has no other co-morbidities that affects occupational performance. Patient will benefit from skilled OT to address above impairments and improve overall function.  MODIFICATION OR ASSISTANCE TO COMPLETE EVALUATION: Min-Moderate modification of tasks or assist with assess necessary to complete an evaluation.  OT OCCUPATIONAL PROFILE AND HISTORY: Detailed assessment: Review of records and additional review of physical, cognitive,  psychosocial history related to current functional performance.  CLINICAL DECISION MAKING: Moderate - several treatment options, min-mod task modification necessary  REHAB POTENTIAL: Good  EVALUATION COMPLEXITY: Moderate  PLAN: OT FREQUENCY: 1-2x/week  OT DURATION: 12 weeks  PLANNED INTERVENTIONS: 97535 self care/ADL training, 44010 therapeutic exercise, 97140 manual therapy, 97039 fluidotherapy, 97010 moist heat, 97034 contrast bath, scar mobilization, passive range of motion, patient/family education, and DME and/or AE instructions  CONSULTED AND AGREED WITH PLAN OF CARE: Patient  Oletta Cohn, OTR/L,CLT 08/30/2023, 7:28 PM

## 2023-09-04 ENCOUNTER — Ambulatory Visit: Payer: BC Managed Care – PPO | Admitting: Occupational Therapy

## 2023-09-06 ENCOUNTER — Ambulatory Visit: Payer: BC Managed Care – PPO | Admitting: Occupational Therapy

## 2023-09-25 ENCOUNTER — Ambulatory Visit: Payer: BC Managed Care – PPO | Admitting: Occupational Therapy

## 2023-10-04 ENCOUNTER — Ambulatory Visit: Payer: BC Managed Care – PPO | Attending: Orthopedic Surgery | Admitting: Occupational Therapy

## 2023-10-04 DIAGNOSIS — G5632 Lesion of radial nerve, left upper limb: Secondary | ICD-10-CM | POA: Insufficient documentation

## 2023-10-04 DIAGNOSIS — M6281 Muscle weakness (generalized): Secondary | ICD-10-CM | POA: Insufficient documentation

## 2023-10-04 NOTE — Therapy (Signed)
 OUTPATIENT OCCUPATIONAL THERAPY ORTHO TREATMENT  Patient Name: Marissa Harrison MRN: [id number] DOB:1976/01/30, 48 y.o., female  PCP: Darrick Penna NP REFERRING PROVIDER: Dr Carola Frost  END OF SESSION:  OT End of Session - 10/04/23 1745     Visit Number 27    Number of Visits 38    Date for OT Re-Evaluation 11/15/23    OT Start Time 1535    OT Stop Time 1613    OT Time Calculation (min) 38 min    Activity Tolerance Patient tolerated treatment well    Behavior During Therapy [facility] for tasks assessed/performed              Past Medical History:  Diagnosis Date   HSV infection    Past Surgical History:  Procedure Laterality Date   BREAST BIOPSY Right 04/13/2017   BENIGN NODULAR ADENOSIS, wing shaped marker   BREAST BIOPSY Left 11/14/2022   [place] Core Bx Ribbon clip path pending   BREAST BIOPSY Left 11/14/2022   [place] LT BREAST BX W LOC DEV 1ST LESION IMG BX SPEC US GUIDE 11/14/2022 [facility]   BREAST CYST ASPIRATION Right 2007   neg   CESAREAN SECTION  1992   ORIF HUMERUS FRACTURE Left 05/17/2023   Procedure: OPEN REDUCTION INTERNAL FIXATION (ORIF) LEFT HUMERUS FRACTURE;  Surgeon: Myrene Galas, MD;  Location: [facility] OR;  Service: Orthopedics;  Laterality: Left;   TUBAL LIGATION  2003   There are no active problems to display for this patient.   ONSET DATE: 05/17/23  REFERRING DIAG: L humerus shaft fx with ORIF and Radial N injury  THERAPY DIAG:  Radial nerve palsy, left  Muscle weakness (generalized)  Rationale for Evaluation and Treatment: Rehabilitation  SUBJECTIVE:   SUBJECTIVE STATEMENT: I have my nerve conduction test next Tuesday.  And then we will probably check in with Dr. Carola Frost again.  I am working out with my trainer and doing 5 to 7 pounds of weight.  My wrist still drops but not as fast.  Feeling my fingers is little more straight.  Feel like there is little more strength in my wrist and hand maybe.Marland Kitchen  PERTINENT HISTORY:  OP NOTE DR HANDY on 05/17/23:  PRE-OPERATIVE DIAGNOSIS:   1. LEFT HUMERAL SHAFT FRACTURE 2. RADIAL NERVE INJURY   POST-OPERATIVE DIAGNOSIS:   1. LEFT HUMERAL SHAFT FRACTURE 2. RADIAL NERVE INJURED BUT LARGELY INTACT   PROCEDURE:  Procedure(s): 1. OPEN REDUCTION INTERNAL FIXATION (ORIF) LEFT HUMERAL SHAFT FRACTURE  2. EXPLORATION AND NEUROPLASTY OF RADIAL NERVE      BRIEF SUMMARY AND INDICATIONS FOR PROCEDURE:  Marissa Harrison is a 48 y.o. who sustained fall resulting in displaced transverse humeral shaft fracture and complete radial nerve deficit.   PRECAUTIONS: No AROM shoulder ABD - not even gravity; AAROM and PROM for wrist , elbow and shoulder flexion; NWB ; Radial N injury  WEIGHT BEARING RESTRICTIONS: No  PAIN:  Are you having pain?  No pain FALLS: Has patient fallen in last 6 months? Yes. 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  And alsothis nerve injury.  NEXT MD VISIT: after nerve conduction test OBJECTIVE:  Note: Objective measures were completed at Evaluation unless otherwise noted.  HAND DOMINANCE: Right  UPPER EXTREMITY ROM:       Passive  ROM Right eval Left eval L 05/31/23 L 06/11/23 L 06/14/23 L 06/18/23 L 06/21/23 L 06/26/23 L 07/10/23 L 07/24/23 L  08/07/23  Shoulder flexion  140 supine 120 PROM supine PROM supine 140 AROM 160 160       Shoulder abduction  PROM 90 supine PROM 90 supine PROM supine 90 95 AROM  155       Shoulder adduction             Shoulder extension     55 55       Shoulder internal rotation             Shoulder external rotation             Elbow flexion  130 AAROM 140 AAROM 150 150 150   150    Elbow extension  -50 coming in - in session -38 AAROM supine after heat -38 coming in - in session -25  -25 coming in - session -15 to -20 -20 to -25 -20 to -23 -15  to -18  -5 to -15 -8 -8 but after  Exs -6   Wrist flexion  90  80         Wrist extension  On table- without gravity - to neutral with AAROM   AROM without gravity to neutral      Over edge of table AROM -55 and place and hold -45  Over edge of table AROM -55 and place and hold -45 Over edge AROM -42 and place and hold -35 - after flexor stretch -35 and place and hold -25   Wrist ulnar deviation  On table - 30  3- sliding on paper on table          Wrist radial deviation  On table - 20  20- slding on table on paper         Wrist pronation  90  90         Wrist supination  90  90         (Blank rows = not tested)  Active ROM Right eval Left eval L 06/11/23 L 06/21/23 L 07/19/23 L 07/26/23 L 08/07/23  Thumb MCP (0-60)         Thumb IP (0-80)         Thumb Radial abd/add (0-55)  Decrease at [facility]   Decrease [facility]    48 Mostly out of [facility]   Thumb Palmar abd/add (0-45)  Unable to maintain - drop into flexion   Able to keep in PA 6 reps place and hold- but drop in flexion with ADD or after 6 reps  Able to maintain palmar abduction position for 10-12 reps.  Able to initiate rubber band for resistance.  30 Able to do PA but drop in flexion with ADD   Thumb Opposition to Small Finger  Unable         Index MCP (0-90)      70 70 -60  Index PIP (0-100)      80 75   Index DIP (0-70)           Long MCP (0-90)       65 65 -60  Long PIP (0-100)        80   Long DIP (0-70)           Ring MCP (0-90)       70 70 -55  Ring PIP (0-100)       90 80  Ring DIP (0-70)           Little MCP (0-90)         -40  Little PIP (0-100)           Little DIP (0-70)           Pt can make fist - unable to extend digits- if [facility] support at [facility] - can extend PIP's  at eval    UPPER EXTREMITY MMT:     MMT Right eval Left eval  Shoulder flexion    Shoulder abduction    Shoulder adduction    Shoulder extension    Shoulder internal rotation    Shoulder external rotation    Middle trapezius    Lower trapezius    Elbow  flexion    Elbow extension    Wrist flexion    Wrist extension    Wrist ulnar deviation    Wrist radial deviation    Wrist pronation    Wrist supination    (Blank rows = not tested)  HAND FUNCTION: 07/19/23:   Grip strength R 65#, left 25# Lateral pinch R 15#, L5# 3 point pinch Right 15#, L3# 2 point pinch R 10#, L1#  08/16/23:   Grip strength R 65#, left 32# resting on thigh and support wrist,Lateral pinch R 15#, L7#; wrist support; 3 point pinch Right 15#, L8# wrist support  COORDINATION: Radial N injury - impaired   SENSATION: Tinel at dorsal wrist -and nerve pain mostly in thumb now   EDEMA: improved greatly  COGNITION: Overall cognitive status: Within functional limits for tasks assessed  TODAY'S TREATMENT:                                                                                                                              DATE: 10/04/23  Patient report no more nerve pain present.   Increase sensation in hand and thumb.   Negative Tinel.   Patient able to maintain wrist in neutral against gravity with forearm unsupported. After weightbearing patient able to get wrist extension 5 to 10 degrees past neutral without gravity But continued to have some discomfort at knot over the dorsal carpal bones. ? Capitate carpal bone migrating more dorsal - crowed by proximal row No pain with wrist extention  Assess traction, compression and dorsal and volar joint mobs to wrist - no pain  Patient  use to carry hand a lot of times and wrist  in flexion as well as hand cupped thumb into palm.  And no splint on Using tenolysis to pick up objects Focus this again on opening the carpal tunnel as well as the metacarpal spreads  webspace spreading the thumb and the radial abduction Also done mobilization to proximal row and facilitate Capitate in place  With great success patient able to weight-bear more towards 90 degrees of wrist extension with weightbearing through palm   Less  less discomfort with weightbearing afterwards  No tenderness  5 weeks ago wrist extension was -25 to -15 against gravity. At this date able to maintain neutral neutral wrist extension against gravity with forearm unsupported Able to do with close fist not open hand.  Continues to have weakness with thumb radial abduction.  Encouraged patient to continue with prefab wrist splint when using hand with any weight-and reach to pick up NOT palm down but neutral - using thumb PA and digits extention  Not using tenolysis to open and pick up objects  PATIENT EDUCATION: Education details: home program as well as splint wearing Person educated: Patient Education method: Explanation, Demonstration, Tactile cues, Verbal cues, and Handouts Education comprehension: verbalized understanding, returned demonstration, verbal cues required, tactile cues required, and needs further education  GOALS: Goals reviewed with patient? Yes  SHORT TERM GOALS: Target date: 4 wks   Patient to be independent and wearing splints correctly to prevent flexion contractures as well as facilitating functional use of left hand Baseline: Patient arrived with wrist splint on but report not really wearing it.  Patient with some discomfort at wrist as well as tightness with wrist extension.  Patient to wear wrist splint when doing elbow and shoulder exercises.  Info provided to order prefab radial nerve splint. NOW patient wore prefab wrist splints.  Patient hard time with facilitating wrist extension past neutral.  Will assess next session for custom static or dynamic wrist extension splint to rest wrist in slight extension/functional extension Goal status:ONGOING  2.  Patient to be independent in home program to initiate passive range of motion and active assisted range of motion to left shoulder to prevent stiffness until next appointment with surgeon Baseline: Patient reports she has been doing active range of motion with  shoulder overhead ;not wearing a sling or compression and not really wearing any more wrist splint or  sleeping with her splint Goal status: MET  LONG TERM GOALS: Target date: 12 wks  Patient to be independent in home program and progression as radial nerve improves to facilitate wrist extension and thumb and digit extension. Baseline: Stiffness in wrist extension.  No wrist extension against gravity.  Partial active assisted range of motion to neutral without gravity on table no digit extension and limited or impaired thumb radial and palmar abduction. NOW can facilitate wrist extension to neutral without gravity on table.  Against gravity place and hold -25.  Tinel improving this week to dorsal hand Goal status ONGOING  2.  Left shoulder active range of motion increased to within functional limits to be able to initiate strengthening Baseline: Only passive and active assisted range of motion in supine for shoulder flexion and passive range of motion for shoulder abduction in supine to 90-surgery 05-17-2023 Goal status: MET  3.  Left elbow flexion and extension improve to within functional limits for patient to reach down to feet as well as washing comb hair. Baseline: Elbow extension coming in -50 and during session after heat in supine with active assisted range of motion -38, flexion 130 degrees. Goal status: MET  4.  Strength in left shoulder and elbow improved for patient to participate in more than 8 pounds of weight to be able to carry a gallon of milk and can transition to community gym for workouts Baseline: Patient at 4 pound weight for workouts elbow and shoulder.  Overhead still increased weakness with incoordination with a 2 pound weight, radial nerve still unable to stabilize wrist extension and digit extension Goal status: ONGOING  5.  Patient to be  independent in scar mobilization to increase motion and elbow flexion and extension. Baseline: Steri-Strips still in place on  incision over midshaft of humerus Goal status: IMET  ASSESSMENT:  CLINICAL IMPRESSION: Patient seen for occupational therapy for left midshaft humerus fracture with ORIF by Dr. Carola Frost patient also has a radial nerve injury from fall.   Patient had made great progress in shoulder and elbow range of motion -patient able to participate the last 5 weeks with trainer working out.  Patient report doing 5 to 7 pounds.  Reminded patient again to be careful with radial nerve and wrist drop with over flexing wrist extensors.  Patient arrived this date after not seen 5 weeks with able to maintain wrist neutral against gravity with hand in a fist.  With forearm unsupported.  5 weeks ago patient was -15 to -25 for wrist extension.  Still decreased digit extension and thumb radial abduction.  Patient still unable to do weight and hand for wrist extension.  Patient to focus the next few weeks on her place and hold wrist extension past neutral as well as without gravity wrist extension past neutral.  Patient has a nerve conduction test next week.  Done this date mobilization of carpals to facilitate space at proximal row  to allow Capitate to set more volar -discomfort with weightbearing decreased  - pt to wear wrist splint with activities to prevent wrist drop.  Patient mostly impaired in progress of her wrist extension and digit extension.    Patient can benefit from skilled OT services to increase motion increase strength to return to prior level of function.  PERFORMANCE DEFICITS: in functional skills including ADLs, IADLs, coordination, sensation, edema, ROM, strength, pain, flexibility, decreased knowledge of precautions, decreased knowledge of use of DME, and UE functional use,    IMPAIRMENTS: are limiting patient from ADLs, IADLs, rest and sleep, work, play, leisure, and social participation.   COMORBIDITIES: has no other co-morbidities that affects occupational performance. Patient will benefit from skilled OT  to address above impairments and improve overall function.  MODIFICATION OR ASSISTANCE TO COMPLETE EVALUATION: Min-Moderate modification of tasks or assist with assess necessary to complete an evaluation.  OT OCCUPATIONAL PROFILE AND HISTORY: Detailed assessment: Review of records and additional review of physical, cognitive, psychosocial history related to current functional performance.  CLINICAL DECISION MAKING: Moderate - several treatment options, min-mod task modification necessary  REHAB POTENTIAL: Good  EVALUATION COMPLEXITY: Moderate  PLAN: OT FREQUENCY: 1-2x/week  OT DURATION: 12 weeks  PLANNED INTERVENTIONS: 97535 self care/ADL training, 13244 therapeutic exercise, 97140 manual therapy, 97039 fluidotherapy, 97010 moist heat, 97034 contrast bath, scar mobilization, passive range of motion, patient/family education, and DME and/or AE instructions  CONSULTED AND AGREED WITH PLAN OF CARE: Patient  Oletta Cohn, OTR/L,CLT 10/04/2023, 5:47 PM

## 2023-10-30 ENCOUNTER — Encounter: Payer: BC Managed Care – PPO | Admitting: Occupational Therapy

## 2023-12-06 ENCOUNTER — Other Ambulatory Visit: Payer: Self-pay | Admitting: Obstetrics and Gynecology

## 2023-12-06 ENCOUNTER — Encounter: Payer: Self-pay | Admitting: Family Medicine

## 2023-12-10 ENCOUNTER — Other Ambulatory Visit: Payer: Self-pay | Admitting: Family Medicine

## 2023-12-10 DIAGNOSIS — N63 Unspecified lump in unspecified breast: Secondary | ICD-10-CM
